# Patient Record
Sex: Female | Born: 1965 | Race: Black or African American | Hispanic: No | Smoking: Never smoker
Health system: Southern US, Community
[De-identification: ages and names within clinical notes are randomized; demographics above are authoritative.]

## PROBLEM LIST (undated history)

## (undated) DIAGNOSIS — I10 Essential (primary) hypertension: Secondary | ICD-10-CM

## (undated) DIAGNOSIS — E119 Type 2 diabetes mellitus without complications: Secondary | ICD-10-CM

## (undated) DIAGNOSIS — T4145XA Adverse effect of unspecified anesthetic, initial encounter: Secondary | ICD-10-CM

## (undated) DIAGNOSIS — M519 Unspecified thoracic, thoracolumbar and lumbosacral intervertebral disc disorder: Secondary | ICD-10-CM

## (undated) DIAGNOSIS — E7800 Pure hypercholesterolemia, unspecified: Secondary | ICD-10-CM

## (undated) HISTORY — PX: DILATION AND CURETTAGE OF UTERUS: SHX78

## (undated) HISTORY — PX: CYST EXCISION: SHX5701

## (undated) HISTORY — PX: CHOLECYSTECTOMY: SHX55

## (undated) HISTORY — PX: BREAST SURGERY: SHX581

---

## 1995-01-24 DIAGNOSIS — T8859XA Other complications of anesthesia, initial encounter: Secondary | ICD-10-CM

## 1995-01-24 HISTORY — PX: CYST EXCISION: SHX5701

## 1995-01-24 HISTORY — DX: Other complications of anesthesia, initial encounter: T88.59XA

## 1998-08-31 ENCOUNTER — Encounter: Payer: Self-pay | Admitting: *Deleted

## 1998-08-31 ENCOUNTER — Inpatient Hospital Stay (HOSPITAL_COMMUNITY): Admission: AD | Admit: 1998-08-31 | Discharge: 1998-08-31 | Payer: Self-pay | Admitting: Family Medicine

## 1998-09-06 ENCOUNTER — Inpatient Hospital Stay (HOSPITAL_COMMUNITY): Admission: AD | Admit: 1998-09-06 | Discharge: 1998-09-06 | Payer: Self-pay | Admitting: Family Medicine

## 1998-09-07 ENCOUNTER — Inpatient Hospital Stay (HOSPITAL_COMMUNITY): Admission: RE | Admit: 1998-09-07 | Discharge: 1998-09-07 | Payer: Self-pay | Admitting: *Deleted

## 1998-09-07 ENCOUNTER — Encounter: Payer: Self-pay | Admitting: *Deleted

## 1998-09-10 ENCOUNTER — Ambulatory Visit (HOSPITAL_COMMUNITY): Admission: RE | Admit: 1998-09-10 | Discharge: 1998-09-10 | Payer: Self-pay | Admitting: *Deleted

## 1998-09-10 ENCOUNTER — Encounter (INDEPENDENT_AMBULATORY_CARE_PROVIDER_SITE_OTHER): Payer: Self-pay | Admitting: Specialist

## 2000-11-29 ENCOUNTER — Other Ambulatory Visit: Admission: RE | Admit: 2000-11-29 | Discharge: 2000-11-29 | Payer: Self-pay | Admitting: *Deleted

## 2001-06-11 ENCOUNTER — Other Ambulatory Visit: Admission: RE | Admit: 2001-06-11 | Discharge: 2001-06-11 | Payer: Self-pay | Admitting: Obstetrics and Gynecology

## 2001-07-03 ENCOUNTER — Other Ambulatory Visit: Admission: RE | Admit: 2001-07-03 | Discharge: 2001-07-03 | Payer: Self-pay | Admitting: Family Medicine

## 2001-07-12 ENCOUNTER — Other Ambulatory Visit: Admission: RE | Admit: 2001-07-12 | Discharge: 2001-07-12 | Payer: Self-pay | Admitting: *Deleted

## 2001-12-16 ENCOUNTER — Other Ambulatory Visit: Admission: RE | Admit: 2001-12-16 | Discharge: 2001-12-16 | Payer: Self-pay | Admitting: *Deleted

## 2003-02-04 ENCOUNTER — Other Ambulatory Visit: Admission: RE | Admit: 2003-02-04 | Discharge: 2003-02-04 | Payer: Self-pay | Admitting: Obstetrics and Gynecology

## 2003-08-23 ENCOUNTER — Inpatient Hospital Stay (HOSPITAL_COMMUNITY): Admission: AD | Admit: 2003-08-23 | Discharge: 2003-08-23 | Payer: Self-pay | Admitting: Obstetrics and Gynecology

## 2003-10-17 ENCOUNTER — Inpatient Hospital Stay (HOSPITAL_COMMUNITY): Admission: AD | Admit: 2003-10-17 | Discharge: 2003-10-17 | Payer: Self-pay | Admitting: Obstetrics and Gynecology

## 2003-11-17 ENCOUNTER — Ambulatory Visit (HOSPITAL_COMMUNITY): Admission: RE | Admit: 2003-11-17 | Discharge: 2003-11-17 | Payer: Self-pay | Admitting: Obstetrics and Gynecology

## 2004-01-11 ENCOUNTER — Inpatient Hospital Stay (HOSPITAL_COMMUNITY): Admission: AD | Admit: 2004-01-11 | Discharge: 2004-01-11 | Payer: Self-pay | Admitting: Obstetrics and Gynecology

## 2004-01-11 ENCOUNTER — Inpatient Hospital Stay (HOSPITAL_COMMUNITY): Admission: AD | Admit: 2004-01-11 | Discharge: 2004-01-13 | Payer: Self-pay | Admitting: Obstetrics and Gynecology

## 2004-01-26 ENCOUNTER — Inpatient Hospital Stay (HOSPITAL_COMMUNITY): Admission: RE | Admit: 2004-01-26 | Discharge: 2004-01-26 | Payer: Self-pay | Admitting: Obstetrics and Gynecology

## 2004-02-12 ENCOUNTER — Inpatient Hospital Stay (HOSPITAL_COMMUNITY): Admission: AD | Admit: 2004-02-12 | Discharge: 2004-02-14 | Payer: Self-pay | Admitting: Obstetrics and Gynecology

## 2004-02-16 ENCOUNTER — Ambulatory Visit (HOSPITAL_COMMUNITY): Admission: RE | Admit: 2004-02-16 | Discharge: 2004-02-16 | Payer: Self-pay | Admitting: Obstetrics and Gynecology

## 2004-02-19 ENCOUNTER — Inpatient Hospital Stay (HOSPITAL_COMMUNITY): Admission: AD | Admit: 2004-02-19 | Discharge: 2004-02-24 | Payer: Self-pay | Admitting: Obstetrics and Gynecology

## 2004-02-22 ENCOUNTER — Encounter (INDEPENDENT_AMBULATORY_CARE_PROVIDER_SITE_OTHER): Payer: Self-pay | Admitting: Specialist

## 2004-02-25 ENCOUNTER — Encounter: Admission: RE | Admit: 2004-02-25 | Discharge: 2004-03-26 | Payer: Self-pay | Admitting: Obstetrics and Gynecology

## 2004-03-24 ENCOUNTER — Inpatient Hospital Stay (HOSPITAL_COMMUNITY): Admission: AD | Admit: 2004-03-24 | Discharge: 2004-03-24 | Payer: Self-pay | Admitting: Obstetrics and Gynecology

## 2004-03-28 ENCOUNTER — Inpatient Hospital Stay (HOSPITAL_COMMUNITY): Admission: AD | Admit: 2004-03-28 | Discharge: 2004-03-28 | Payer: Self-pay | Admitting: Obstetrics and Gynecology

## 2004-04-02 ENCOUNTER — Inpatient Hospital Stay (HOSPITAL_COMMUNITY): Admission: AD | Admit: 2004-04-02 | Discharge: 2004-04-02 | Payer: Self-pay | Admitting: Obstetrics and Gynecology

## 2004-04-05 ENCOUNTER — Other Ambulatory Visit: Admission: RE | Admit: 2004-04-05 | Discharge: 2004-04-05 | Payer: Self-pay | Admitting: Obstetrics and Gynecology

## 2004-04-24 ENCOUNTER — Encounter: Admission: RE | Admit: 2004-04-24 | Discharge: 2004-05-24 | Payer: Self-pay | Admitting: Obstetrics and Gynecology

## 2004-06-24 ENCOUNTER — Encounter: Admission: RE | Admit: 2004-06-24 | Discharge: 2004-07-24 | Payer: Self-pay | Admitting: Obstetrics and Gynecology

## 2004-08-24 ENCOUNTER — Encounter: Admission: RE | Admit: 2004-08-24 | Discharge: 2004-09-23 | Payer: Self-pay | Admitting: Obstetrics and Gynecology

## 2004-09-24 ENCOUNTER — Encounter: Admission: RE | Admit: 2004-09-24 | Discharge: 2004-10-23 | Payer: Self-pay | Admitting: Obstetrics and Gynecology

## 2004-10-24 ENCOUNTER — Encounter: Admission: RE | Admit: 2004-10-24 | Discharge: 2004-11-23 | Payer: Self-pay | Admitting: Obstetrics and Gynecology

## 2004-11-24 ENCOUNTER — Encounter: Admission: RE | Admit: 2004-11-24 | Discharge: 2004-12-24 | Payer: Self-pay | Admitting: Obstetrics and Gynecology

## 2006-12-28 ENCOUNTER — Encounter: Admission: RE | Admit: 2006-12-28 | Discharge: 2006-12-28 | Payer: Self-pay | Admitting: Family Medicine

## 2007-01-01 ENCOUNTER — Encounter: Admission: RE | Admit: 2007-01-01 | Discharge: 2007-01-01 | Payer: Self-pay | Admitting: Family Medicine

## 2007-01-15 ENCOUNTER — Encounter: Admission: RE | Admit: 2007-01-15 | Discharge: 2007-01-15 | Payer: Self-pay | Admitting: Family Medicine

## 2007-10-15 ENCOUNTER — Emergency Department (HOSPITAL_COMMUNITY): Admission: EM | Admit: 2007-10-15 | Discharge: 2007-10-15 | Payer: Self-pay | Admitting: Family Medicine

## 2007-11-10 ENCOUNTER — Emergency Department (HOSPITAL_COMMUNITY): Admission: EM | Admit: 2007-11-10 | Discharge: 2007-11-10 | Payer: Self-pay | Admitting: Emergency Medicine

## 2007-11-12 ENCOUNTER — Encounter: Admission: RE | Admit: 2007-11-12 | Discharge: 2007-11-12 | Payer: Self-pay | Admitting: Surgery

## 2007-12-25 ENCOUNTER — Encounter: Admission: RE | Admit: 2007-12-25 | Discharge: 2007-12-25 | Payer: Self-pay | Admitting: Surgery

## 2007-12-26 ENCOUNTER — Encounter (INDEPENDENT_AMBULATORY_CARE_PROVIDER_SITE_OTHER): Payer: Self-pay | Admitting: Surgery

## 2007-12-26 ENCOUNTER — Ambulatory Visit (HOSPITAL_COMMUNITY): Admission: RE | Admit: 2007-12-26 | Discharge: 2007-12-28 | Payer: Self-pay | Admitting: Surgery

## 2008-03-30 ENCOUNTER — Encounter: Admission: RE | Admit: 2008-03-30 | Discharge: 2008-03-30 | Payer: Self-pay | Admitting: Surgery

## 2010-02-13 ENCOUNTER — Encounter: Payer: Self-pay | Admitting: Obstetrics and Gynecology

## 2010-03-10 IMAGING — MG MM DIAGNOSTIC UNILATERAL R
2 series · 2 of 2 positions shown · non-contrast
Comparison: Previous examinations, the most recent dated
01/15/2007, 01/01/2007 and 12/28/2006.

CLINICAL DATA: Right breast swelling, redness and increased
warmth.  The patient recently started on antibiotics.  Clinical
concern for the possibility of an abscess.

DIGITAL DIAGNOSTIC  RIGHT  MAMMOGRAM  WITH CAD AND RIGHT BREAST
ULTRASOUND:

[R CC]
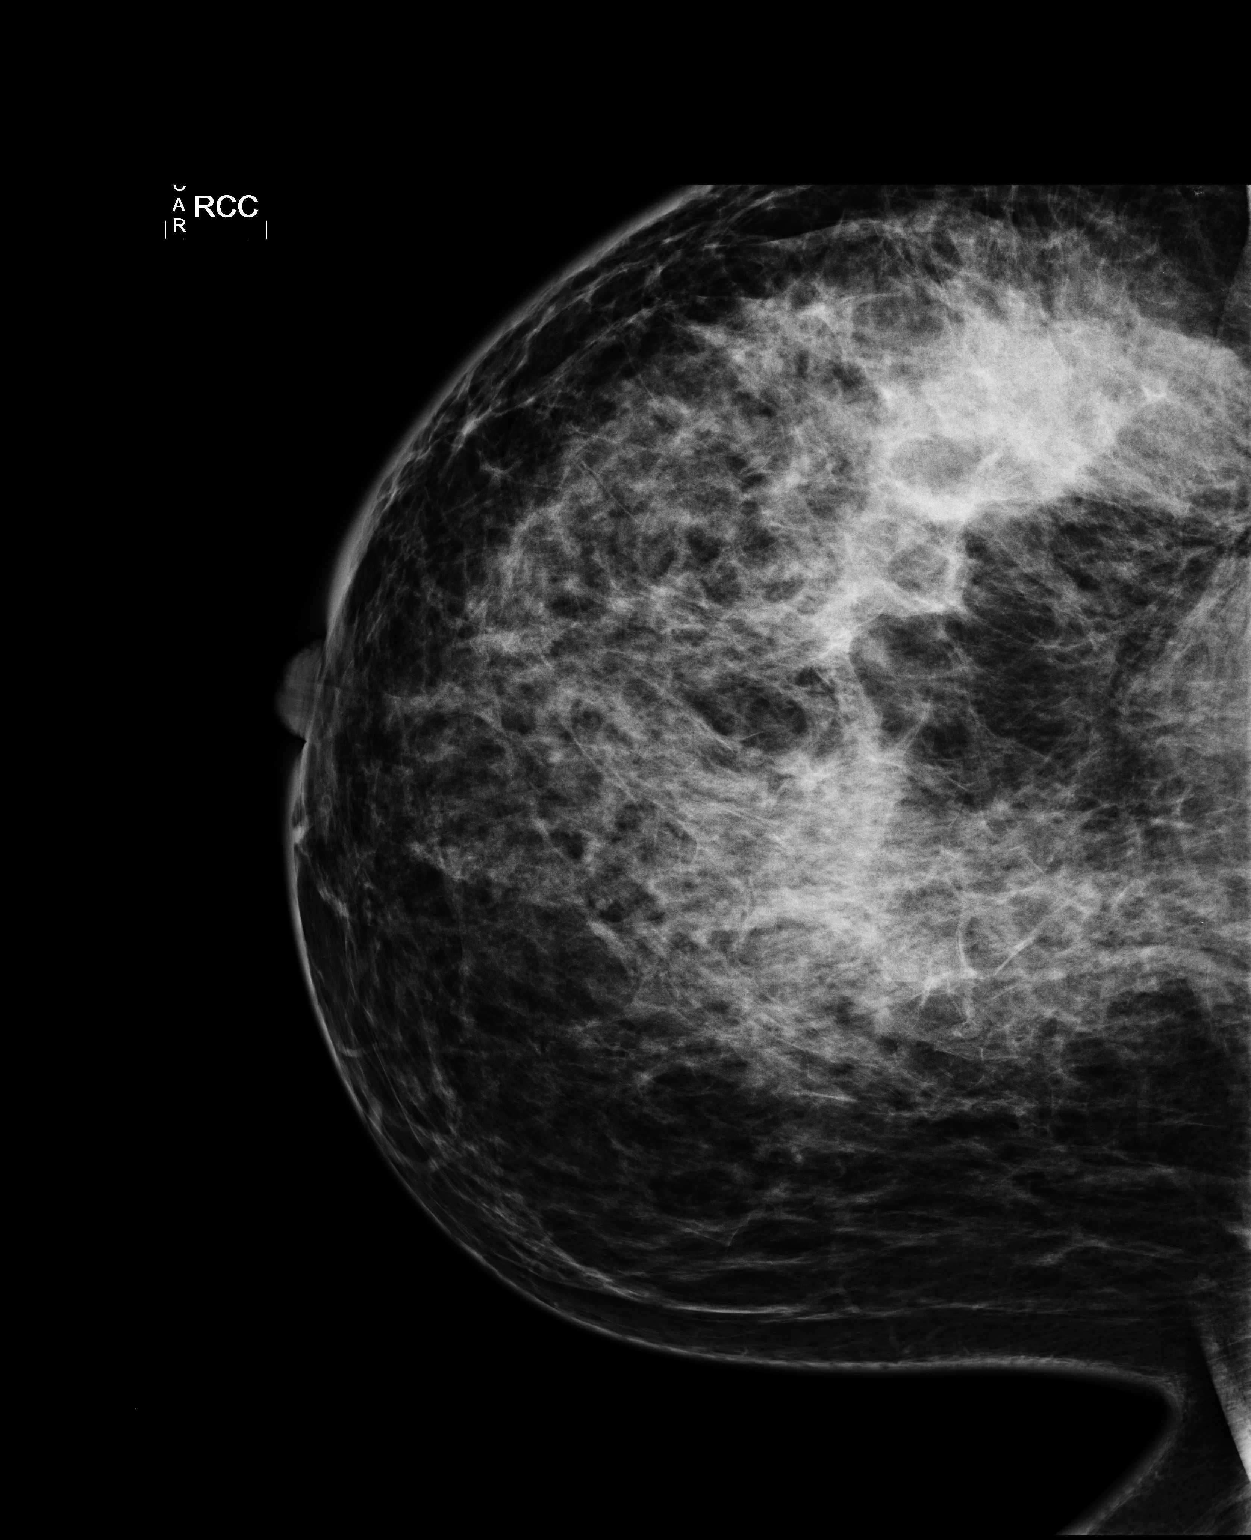

[R MLO]
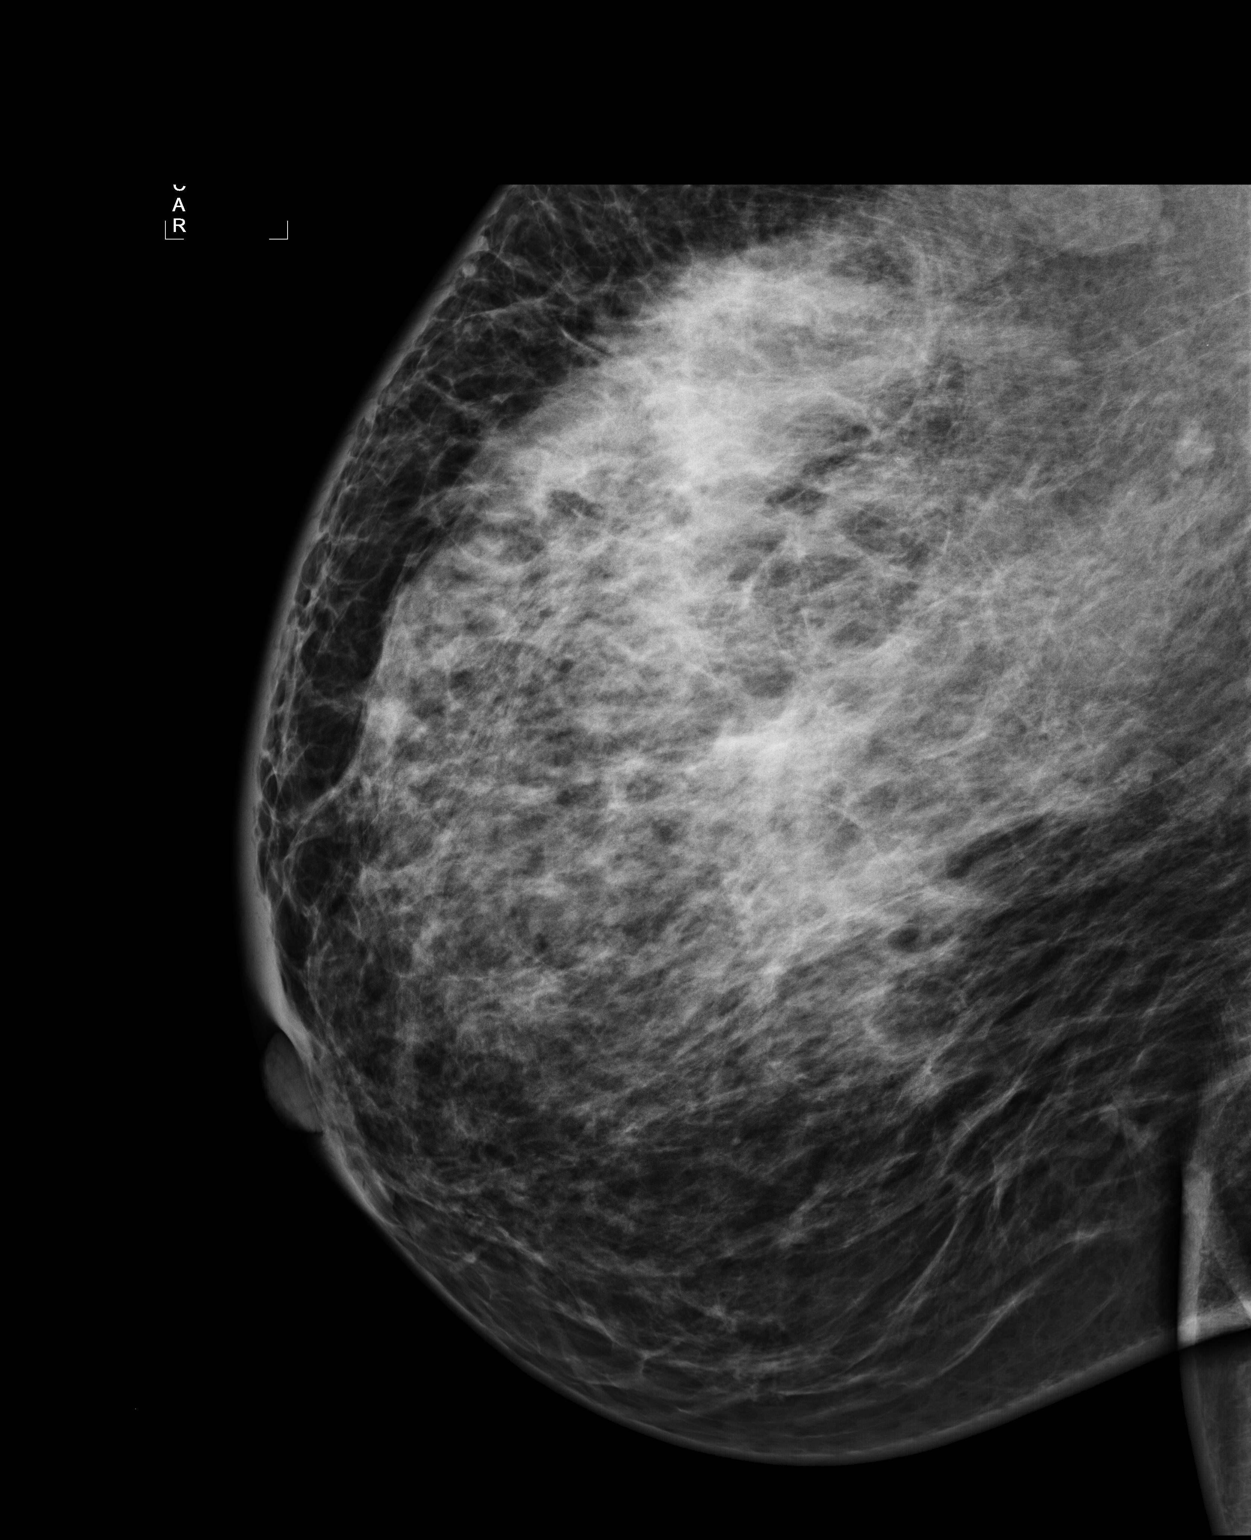

[2 of 2 positions shown; findings below may reference images not displayed]

FINDINGS: Mammographic views of the right breast demonstrate an
overall increase in density of heterogeneously dense parenchyma.
There is also interval skin thickening, primarily in the upper
outer portion of the breast.  There has been an interval increase
in the size of multiple right axillary lymph nodes.  No discrete
mass or other findings suspicious for malignancy or abscess
formation are demonstrated.  The previously evaluated probably
benign microcalcifications deep in the outer right breast are
unchanged.  Spot magnification views of the calcifications were not
obtained today due to the tenderness of the breast.

On physical exam, a large area of firm, palpable soft tissue
thickening is demonstrated in the upper outer right breast.

Ultrasound is performed, showing extensive edematous changes
throughout the region of palpable soft tissue thickening in the
upper outer right breast.  No discrete masses or fluid collections
are seen.  A small, normal sized intramammary lymph node is
demonstrated adjacent to the the area of edematous changes.  There
is also an enlarged right axillary lymph node with a preserved
fatty hilum, measuring 3.1 cm in maximum diameter.
IMPRESSION: 1.  Extensive cellulitis in the upper outer quadrant of the right
breast with associated reactive lymph nodes.  Inflammatory breast
cancer with metastatic lymph nodes cannot be excluded.  Therefore,
further evaluation will be necessary if the patient's symptoms do
not resolve with antibiotics.  No abscess is seen.
2.  Stable probably benign right breast calcifications.  However,
these could not be evaluated with magnification views today, as
described above.  When the patient is due for her next mammogram,
in two months, it is recommended that this be performed as a
diagnostic mammogram, with spot magnification views on the right.

BI-RADS CATEGORY 3:  Probably benign finding(s) - short interval
follow-up suggested.

## 2010-06-07 NOTE — Discharge Summary (Signed)
[name], [name]              ACCOUNT NO.:  [id number]   MEDICAL RECORD NO.:  [id number]          PATIENT TYPE:  OIB   LOCATION:  5127                         FACILITY:  [facility]   PHYSICIAN:  Currie Paris, M.D.DATE OF BIRTH:  Nov 04, 1965   DATE OF ADMISSION:  12/26/2007  DATE OF DISCHARGE:  12/28/2007                               DISCHARGE SUMMARY   FINAL DIAGNOSES:  1. Complex right breast abscess.  2. Hypertension.   CLINICAL HISTORY:  [name] is a 45 year old lady who has been  followed in our office, often on antibiotics for what looked like a  breast mastitis.  The day prior to admission, she had been reevaluated  in the office and found to have a developed complex abscess with  multiple loculations.  She was admitted on December 26, 2007, for  intraoperative drainage of her complex breast abscess.   HOSPITAL COURSE:  The patient was admitted and taken to the operating  room.  Under general anesthesia, the complex breast abscess was opened.  There are multiple loculations all of which were broken up.  Cultures  were taken.  Biopsies were taken.  The wound was packed with Kerlix.   The next day, the patient felt much better.  We had planned at the time  of surgery to bring her back to the operating room for dressing change  and this was accomplished on December 27, 2007.  This was done again  under anesthesia and she tolerated that well.  On December 28, 2007, she  was continued to feel well.  She was afebrile.  Her pain was  considerably improved from preoperatively.  I was able to do her  dressing repacking in the room on December 28, 2007, with some morphine  sedation and she tolerated that well.  I thought this could be managed  as an outpatient.  Culture report was still pending, but there was no  growth on the cultures at the time of discharge.  Pathology report  showed inflammatory changes.  There was no evidence of tumor.   The patient will go home on  Vicodin for pain, Keflex for the infection,  and be followed in my office in approximately 48 hours for a wound  change.      Currie Paris, M.D.  Electronically Signed     CJS/MEDQ  D:  12/28/2007  T:  12/28/2007  Job:  [id number]

## 2010-06-07 NOTE — Op Note (Signed)
NAMEIONE, SANDUSKY              ACCOUNT NO.:  [id number]   MEDICAL RECORD NO.:  [id number]          PATIENT TYPE:  AMB   LOCATION:  SDS                          FACILITY:  [facility]   PHYSICIAN:  Currie Paris, M.D.DATE OF BIRTH:  [birth year]   DATE OF PROCEDURE:  12/27/2007  DATE OF DISCHARGE:                               OPERATIVE REPORT   PREOPERATIVE DIAGNOSIS:  Complex breast abscess, status post drainage  and debridement.   POSTOPERATIVE DIAGNOSIS:  Complex breast abscess, status post drainage  and debridement.   OPERATION:  Dressing change under anesthesia.   SURGEON:  Currie Paris, MD   ASSISTANT:  Letha Cape, PA   ANESTHESIA:  General.   CLINICAL HISTORY:  This is a 45 year old lady who had a complex breast  abscess, which was drained intraoperatively yesterday.  There were  multiple small cavities and tracts that were opened widely yesterday.  A  large Kerlix had been placed for packing.   DESCRIPTION OF PROCEDURE:  The patient was seen in the holding area and  had no further questions.  We confirmed dressing change under anesthesia  as the planned procedure, and the right breast was marked as the  operative site.   The patient was taken to the operating room and after satisfactory  general anesthesia had been obtained, the breast was prepped and draped  and the time-out done.  The Kerlix was removed after she was asleep.  I  carefully inspected the wound and the wound appeared very clean.  There  appeared to be no other pockets of frank pus.  There were multiple side  pockets had been noted yesterday, but they all appeared to be clean.  I  irrigated with about 1 L of saline.  I made sure everything was dry, but  there had been no bleeding during this portion of the procedure.  I then  packed this again with some Kerlix using several pieces, so I could get  each of the small subcavities packed.  Sterile dressings were applied.  The patient  tolerated the procedure well.  There were no complications.  All counts were correct.      Currie Paris, M.D.  Electronically Signed     CJS/MEDQ  D:  12/27/2007  T:  12/27/2007  Job:  [id number]

## 2010-06-07 NOTE — Op Note (Signed)
[name], [name]              ACCOUNT NO.:  [id number]   MEDICAL RECORD NO.:  [id number]          PATIENT TYPE:  OIB   LOCATION:  5127                         FACILITY:  [facility]   PHYSICIAN:  Currie Paris, M.D.DATE OF BIRTH:  December 11, 1965   DATE OF PROCEDURE:  12/26/2007  DATE OF DISCHARGE:                               OPERATIVE REPORT   PREOPERATIVE DIAGNOSIS:  Complex multiloculated abscess, right breast.   POSTOPERATIVE DIAGNOSIS:  Complex multiloculated abscess, right breast.   OPERATION:  Wide incision and drainage of complex breast abscess with  breast biopsy.   SURGEON:  Currie Paris, MD   ANESTHESIA:  General.   CLINICAL HISTORY:  This is a 45 year old lady who has developed a  mastitis, was initially treated with some antibiotics and it did not  look like she had any drainable areas, but on presentation to our office  yesterday appeared to have an abscess developed and it would be amenable  to drains.  Ultrasound showed a multiloculated abscess.   DESCRIPTION OF PROCEDURE:  The patient was seen in the holding area and  I reviewed her situation with her and examined her.  She had a fluctuant  area that was bulging in the upper outer quadrant of the right breast, a  couple centimeters in the areolar margin with a large area of  inflammatory changes involving basically entire upper half of her  breast.  She understood the plans for the procedure and had no further  questions.   The patient was taken to the operating room and after satisfactory  general anesthesia had been obtained, the right breast prepped and  draped and the time-out was done.  I made a curvilinear incision  directly over the fluctuant area and entered the cavity with purulent  material draining which was cultured.  I began diligently to break up  some loculations and found that I needed to open the incision more  laterally and more medially to get good wide drainage.  Initially, I  thought I had everything opened up, but then would intermittently see  areas of purulent drainage at the edges where I had broken up and  looking at those areas and probing, I found additional pockets.  I spent  a long time trying to irrigate all of this out and break up all of these  loculations and finally thought that I had everything completely opened  and widely drained.  I irrigated it copiously.  I took several biopsies  of portions of the wall to be sure there was not carcinoma here, but  this all looked like inflammatory changes consistent with a chronic  longstanding abscess with multiple loculations.   Once I thought everything was dry, I went ahead and put a moist gauze  packing in.  I believe she will need to come back to the operating room  for dressing change under anesthesia in 24 hours.   The patient tolerated the procedure well.  There were no operative  complications.  All counts were correct.      Currie Paris, M.D.  Electronically Signed  CJS/MEDQ  D:  12/26/2007  T:  12/26/2007  Job:  [id number]   cc:   Renaye Rakers, M.D.  [facility]

## 2010-06-10 NOTE — Discharge Summary (Signed)
[name], [name]              ACCOUNT NO.:  [id number]   MEDICAL RECORD NO.:  [id number]          PATIENT TYPE:  INP   LOCATION:  9314                          [facility]   PHYSICIAN:  Janine Limbo, M.D.DATE OF BIRTH:  [birth year]   DATE OF ADMISSION:  02/19/2004  DATE OF DISCHARGE:                                 DISCHARGE SUMMARY   ADMITTING DIAGNOSES:  1.  Intrauterine pregnancy at 32 and three-sevenths weeks.  2.  Preterm labor with positive fetal fibronectin.  3.  Positive group B streptococcus.  4.  Chronic hypertension with superimposed preeclampsia.  5.  Status post betamethasone preparation.  6.  Estimated fetal weight 90-95th percentile.   PROCEDURE:  Normal spontaneous vaginal delivery.   DISCHARGE DIAGNOSES:  1.  Normal spontaneous vaginal delivery at 32 and six-sevenths weeks.  2.  Improved preeclampsia.   [name] is a 45 year old gravida 3 para 1-0-1-1 who presents for  evaluation of preterm labor, chronic hypertension, and was found to have  superimposed preeclampsia with a total protein for 24-hour urine of 547 mg.  Her blood work has otherwise remained within normal limits with regard to  [facility] lab work.  On February 22, 2004 discussion was entered into with the  patient regarding her status and it was recommended per discussion dated  February 22, 2004 at 1100 by Dr. Dierdre Forth that induction of labor was  recommended.  The patient discussed prognosis with neonatologist and then  agreed to proceed with induction of labor for preeclampsia.  Artificial  rupture of membranes was accomplished on February 22, 2004 at 2110 by Dr.  Dierdre Forth with return of clear fluid.  At 2228, Dr. Pennie Rushing was  notified of variable decelerations which started immediately after epidural.  In the 2 minutes following that, Dr. Pennie Rushing was called immediately for  delivery.  Delivery occurred at 2234 and was attended by Nigel Bridgeman,  C.N.M. for a normal  spontaneous vaginal delivery over intact perineum.  Placenta was spontaneous and intact.  The patient gave birth to a 5-pound 15-  ounce female infant named Jalen with Apgar scores of 8 at one minute and 8 at  five minutes.  The baby is in NICU and stable.  The patient's blood  pressures remained stable in the postpartum period - 130s to 150s over 60s  to 80s.  Her PIH labs remained in normal limits and she continued on  magnesium sulfate x24 hours, at which time magnesium sulfate was  discontinued.  The patient's blood pressures remained stable in the 140s  over 80s range.  Her weight on her postpartum day #1 was 221; today her  weight is 220.  Her hemoglobin on postpartum day #1 was 12.5 and her vital  signs remained stable on Aldomet 500 mg p.o. b.i.d.  In light of her stable  condition, she is satisfactory for discharge per Dr. Marline Backbone.   DISCHARGE INSTRUCTIONS:  Per [facility] handout.   DISCHARGE MEDICATIONS:  1.  Aldomet 500 mg p.o. b.i.d.  2.  Motrin 600 mg p.o. q.6h. p.r.n. pain.  3.  Prenatal  vitamins.   The patient will return in 1 week for evaluation at the office of [facility].  She  is to bring measurement of daily weight with her.  She is to call for any  signs or symptoms of PIH:  Headache, visual changes, or epigastric pain, or  any problems or concerns.      SDM/MEDQ  D:  02/24/2004  T:  02/24/2004  Job:  [id number]

## 2010-06-10 NOTE — Discharge Summary (Signed)
[name], [name]              ACCOUNT NO.:  [id number]   MEDICAL RECORD NO.:  [id number]          PATIENT TYPE:  INP   LOCATION:  9168                          [facility]   PHYSICIAN:  Osborn Coho, M.D.   DATE OF BIRTH:  Aug 23, 1965   DATE OF ADMISSION:  02/12/2004  DATE OF DISCHARGE:                                 DISCHARGE SUMMARY   ADMITTING DIAGNOSES:  1.  Intrauterine pregnancy at 22 and two-sevenths weeks.  2.  Chronic hypertension, on Aldomet.  3.  Preterm labor.  4.  Positive fetal fibronectin.   DISCHARGE DIAGNOSES:  1.  Intrauterine pregnancy at 92 and two-sevenths weeks.  2.  Chronic hypertension, on Aldomet.  3.  Preterm labor stable with no cervical change.  4.  Positive fetal fibronectin.   [name] is a 45 year old gravida 3 para 1-0-1-1 who presented at 41 and  two-sevenths weeks with contractions two to four per hour.  Her cervix was 1  cm, 50-60%, with the vertex at a -2 station and ballotable.  She had a  positive fetal fibronectin on February 11, 2004 and received betamethasone  January 11, 2004.  Terbutaline subcu was unsuccessful in stopping  contractions and the patient was admitted for magnesium sulfate.  On  magnesium sulfate IV at 2 g/hour, the patient's contractions significantly  decreased.  Her baby remained reactive and reassuring throughout her  admission.  She continued to contract irregularly but was weaned off the  magnesium on February 14, 2004 with no increase in level of the contractions.  Her cervix remained 1, 75%, -2, and ballotable which was unchanged from  previous exam.  She was therefore deemed stable and decision was made to be  discharged home on level 3 bedrest.  The patient is to take terbutaline 2.5  mg p.o. q.4h. at home and Aldomet as directed.  She will follow up in the  office of [facility] as scheduled on Tuesday and Friday this week for NST.  She  will call for any signs or symptoms of increased preterm labor, any  decreased fetal movement, vaginal bleeding or rupture of membranes, or any  problems or concerns.      SDM/MEDQ  D:  02/14/2004  T:  02/14/2004  Job:  [id number]

## 2010-06-10 NOTE — H&P (Signed)
[name], [name]              ACCOUNT NO.:  [id number]   MEDICAL RECORD NO.:  [id number]          PATIENT TYPE:  INP   LOCATION:  9168                          [facility]   PHYSICIAN:  Osborn Coho, M.D.   DATE OF BIRTH:  05-Apr-1965   DATE OF ADMISSION:  02/12/2004  DATE OF DISCHARGE:                                HISTORY & PHYSICAL   [name] is a 44 year old gravida 3 para 1-0-1-1 at 31-2/7ths weeks who  presents with 2 to 4 uterine contractions per hour in the last 24 hours.  She was seen, on February 11, 2004, at [facility].  Her cervix  at that time was 1-cm, 50 to 60%, vertex, to the -2 station with ballotable  nature.  Positive fetal fibronectin was noted on that day.  Yesterday she  was instructed to call back if she should have more contractions, despite  being on terbutaline on home.  She has had betamethasone on January 11, 2004.  She has been on terbutaline 2.5 mg p.o. q.4h. with her last dose at  10:30 a.m. this morning.  She denies any leaking or bleeding and reports  positive fetal movement.  She denies dysuria, nausea, vomiting.   Her pregnancy has been remarkable for:  1.  Chronic hypertension with the patient on Aldomet 250 mg b.i.d.  2.  Positive for Group B strep.  3.  Positive fetal fibronectin with previous betamethasone.  4.  Advanced maternal age with normal amnio.  5.  First trimester bleeding.   PRENATAL LABS:  Blood type is O positive.  Rh antibody negative.  VDRL  nonreactive.  __________  titer positive.  Hepatitis B surface antigen  negative.  HIV nonreactive.  Sickle cell test negative.  GC and Chlamydia  cultures were negative in the first trimester.  They were negative again on  January 12, 2004.  Hemoglobin, upon entering the practice, was 12.1.  It  was 11 at 27 weeks.  Group B strep culture was positive in December.  EDC of  April 12, 2004 was established by last menstrual period and was in agreement  with  ultrasound at 6 and 18 weeks.   HISTORY OF PRESENT PREGNANCY:  The patient entered care at approximately ten  weeks.  A Pap was done at her first visit and was normal.  She declined  amniocentesis and decided quadruple screen.  Her pressure at her OB visit  was 140/88.  She was placed on Aldomet at the beginning of her pregnancy 250  mg b.i.d.  She had an ultrasound at 26 weeks showing growth at the 75th to  90th percentile.  Cervix was 2.2-cm long, 1-2 by exam long, -3.  She was  sent to maternity admissions unit to check for contractions.  She received  betamethasone at that time.  Fetal fibronectin was negative at that point.  One hour Glucola was 137, three hour GTT had one abnormal value on the three  hour value at 159.  She was seen here at maternity admissions, on January 26, 2004, had a negative fetal fibronectin, then had  a followup yesterday that  was positive.   OBSTETRICAL HISTORY:  1.  In 1995, she had a vaginal birth of a female infant, weight 7 pounds 13      ounces at 41 weeks.  She was in labor eight and a half hours.  This was      a vacuum-assisted vaginal birth by Dr. Stefano Gaul.  She had epidural      anesthesia.  2.  In 2000, she had a first trimester miscarriage with a D&E.   MEDICAL HISTORY:  1.  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  2.  She reports usual childhood illnesses.  3.  She was diagnosed with hypertension, in 1998, and has been on Aldomet      250 p.o. b.i.d.   SURGICAL HISTORY:  1.  Lap cholecystectomy in 1999.  2.  She also had a cyst removed from her head in the past.  3.  D&C in 2000.   FAMILY HISTORY:  Her mother, father, sister, and brother all have  hypertension.  Her maternal grandmother had a seizure.  Her mother and  maternal grandmother have diabetes.   GENETIC HISTORY:  Remarkable for the patient being 38 at the time of  delivery.   SOCIAL HISTORY:  The patient is married to the father of the baby.  He is  involved  and supportive.  His name is Veretta Sabourin.  The patient is African  American of the [employer].  She is employed as a Merchandiser, retail in  [employer], arts.  Her husband is employed at [employer].  She has been  followed by the physician service at [facility] .  She denies  any alcohol, drug, or tobacco use during this pregnancy.   PHYSICAL EXAMINATION:  VITAL SIGNS:  Blood pressures are 149/81, 157/98,  other vital signs are stable.  HEENT:  Within normal limits.  LUNGS:  Breath sounds are clear.  HEART:  Regular rate and rhythm without murmur.  BREASTS:  Soft and nontender.  ABDOMEN:  Fundal height is approximately 32-cm.  Uterine contractions are 4-  6 per hour, mild.  Clean catch urine showed a specific gravity of 1.004, all  else negative.  Cervix is a loose, 1-cm, 80% vertex, at a -1 station with a  vertex well applied.  Membranes tight against the vertex.  EXTREMITIES:  Deep tendon reflexes are 2+ without clonus.  There is a trace  edema noted.   IMPRESSION:  1.  Intrauterine pregnancy, 31-2/7ths weeks.  2.  Preterm labor.  3.  Positive fetal fibronectin.  4.  Positive Group B strep.  5.  Chronic hypertension.   PLAN:  1.  Admit for magnesium sulfate per Dr. Su Hilt as attending physician.  2.  Aldomet 250 mg one p.o. b.i.d.  3.  M.D. will follow.     Vick   VLL/MEDQ  D:  02/12/2004  T:  02/12/2004  Job:  [id number]

## 2010-06-10 NOTE — H&P (Signed)
[name], [name]              ACCOUNT NO.:  [id number]   MEDICAL RECORD NO.:  [id number]          PATIENT TYPE:  INP   LOCATION:  9157                          [facility]ACILITY[facility]  [facility]   PHYSICIAN:  Janine Limbo, M.D.DATE OF BIRTH:  [birth year]   DATE OF ADMISSION:  01/11/2004  DATE OF DISCHARGE:                                HISTORY & PHYSICAL   HISTORY OF PRESENT ILLNESS:  This is a 45 year old gravida 3, para 1-0-1-1-  at 26-6/7 weeks who presents with complaints of uterine cramping.  She was  seen earlier today with no uterine contractions observed at that time, but  she did have premature dilation at 2 cm.  Fetal fibronectin was done today  and returned as positive.  She received her first dose of betamethasone  earlier today.  Her pregnancy has been followed by Dr. Janine Limbo  and remarkable for:  #1 - AMA (declined amniocentesis), #2 - first trimester  bleeding, #3 - chronic hypertension.   OBSTETRICAL HISTORY:  OB history is remarkable for a vacuum-assisted vaginal  delivery in 1995 of a female infant at 73 weeks' gestation weighing 7 pounds  13 ounces with no complications.  She had a spontaneous abortion in 2000 in  the first trimester with a D&E.   MEDICAL HISTORY:  Medical history is remarkable for:  1.  HPV.  2.  Childhood varicella.  3.  Chronic hypertension.   FAMILY HISTORY:  Family history is remarkable for mother, father, sister and  brother with hypertension; maternal grandmother with seizures; mother and  grandmother with diabetes.   SURGICAL HISTORY:  Surgical history is remarkable for:  1.  Laparoscopic cholecystectomy in 1999.  2.  A cyst removed from her head.  3.  A D&C.   GENETIC HISTORY:  Unremarkable.   SOCIAL HISTORY:  The patient is married to [place], who is involved  and supportive.  She is of the [place] faith.  She denies any alcohol,  tobacco or drug use.   PRENATAL LABORATORIES:  Hemoglobin 12.1, platelets 340,000.  Blood  type O-  positive, antibody screen negative.  Sickle cell negative.  RPR nonreactive.  Rubella immune.  Hepatitis negative.  HIV negative.  Gonorrhea negative.  Chlamydia negative.  Fetal fibronectin positive.   HISTORY OF CURRENT PREGNANCY:  Patient entered care at 10 weeks' gestation.  She initially wanted an amniocentesis for genetic studies, but later  declined.  She had a normal ultrasound at [facility].   OBJECTIVE DATA:  VITAL SIGNS:  Vital signs stable at blood pressure of  146/89, afebrile.  HEENT:  Within normal limits.  NECK:  Thyroid normal, not enlarged.  CHEST:  Chest clear to auscultation.  HEART:  Heart rate regular rate and rhythm.  ABDOMEN:  Abdomen gravid at 27 cm, vertex to Leopold's.  EFM shows reactive  fetal heart rate tracing with uterine contractions every 2-3 minutes prior  to terbutaline and she stopped having contractions after 1 dose of  terbutaline.  PELVIC:  Cervix is 2 cm and 2.2 cm per earlier ultrasound.   ASSESSMENT:  1.  Intrauterine pregnancy at  26-6/7 weeks.  2.  Preterm labor with positive fetal fibronectin.   PLAN:  1.  Admit for monitoring and tocolysis.  2.  Terbutaline p.r.n.  3.  The patient needs gonorrhea, Chlamydia and group B strep done tomorrow      per Dr. Samule Ohm A. Dillard.  4.  Bedrest and monitoring.  5.  Further orders to follow.     Mari   MLW/MEDQ  D:  01/11/2004  T:  01/12/2004  Job:  [id number]

## 2010-06-10 NOTE — H&P (Signed)
[name], [name]              ACCOUNT NO.:  [id number]   MEDICAL RECORD NO.:  [id number]          PATIENT TYPE:  INP   LOCATION:  9172                          FACILITY:  [facility]   PHYSICIAN:  Naima A. Dillard, M.D. DATE OF BIRTH:  [birth year]   DATE OF ADMISSION:  02/19/2004  DATE OF DISCHARGE:                                HISTORY & PHYSICAL   HISTORY OF PRESENT ILLNESS:  This is a 45 year old gravida 3 para 1-0-1-1 at  9 and three-sevenths weeks who presents for evaluation of preterm labor and  chronic hypertension.  She denies pain with contractions, leaking, or  bleeding, and reports positive fetal movement.  She has had a positive fetal  fibronectin on February 11, 2004 and received betamethasone on that day.  She  was in the hospital for 2 days at that time and was discharged home on  February 14, 2004 with a cervix of 1, 75%, and -2.  Her cervix today is 2,  90%, and -1 with a vertex presentation and she is being admitted for  observation for preterm labor and hypertension.   Her pregnancy has been followed by Dr. Stefano Gaul and remarkable for:  1.  Chronic hypertension.  2.  Group B strep positive.  3.  Positive fetal fibronectin with history of receiving betamethasone.  4.  Advanced maternal age with normal amniocentesis.  5.  First trimester bleeding.   PRENATAL LABORATORY DATA:  Her hemoglobin 12.1.  Blood type O positive,  antibody screen negative.  RPR nonreactive.  Rubella immune.  Hepatitis  negative. HIV negative.  Sickle cell negative.  GC/chlamydia negative in the  first trimester and again in December.  Group B strep positive in December.   HISTORY OF CURRENT PREGNANCY:  The patient entered care at [redacted] weeks  gestation.  Pap was done at her visit and was normal.  She had an initial  blood pressure at her first visit of 140/88.  She was placed on Aldomet 250  mg b.i.d.  She had an ultrasound at 6 and 18 weeks which were normal.  She  had an ultrasound at 26 weeks  showing growth in the 75-90 percentile.  Cervix was 2.2 cm long at that time.  She was sent to maternity admissions  to check for contractions and received betamethasone at that time.  She  initially had a negative fetal fibronectin.  She had a 1-hour Glucola of 137  and then a 3-hour GTT which had one abnormal value.  She had another fetal  fibronectin on January 3 which was negative, followed by a positive one on  January 19.   OBSTETRICAL HISTORY:  Remarkable for vaginal vacuum-assisted birth in 33  of a female infant weighing 7 pounds 13 ounces at [redacted] weeks gestation.  In 2000  she had a first trimester miscarriage with a D&E.   MEDICAL HISTORY:  Remarkable for an abnormal Pap with HPV changes, usual  childhood illnesses.  She was diagnosed with chronic hypertension in 1998  and has been on Aldomet.   SURGICAL HISTORY:  Remarkable for a laparoscopic cholecystectomy in 1999, a  cyst removed from her head, and a D&C in 2000.   FAMILY HISTORY:  Remarkable for her mother, father, sister, and brother  having hypertension; her maternal grandmother with a seizure; and mother and  maternal grandmother with diabetes.   GENETIC HISTORY:  Remarkable for the patient's age of 59.   SOCIAL HISTORY:  The patient is married to [place] who is involved and  supportive.  She is of the [place] faith.  She is employed as a Merchandiser, retail  in [place], [employer].  Her husband is employed at [employer].  She denies  any alcohol, tobacco, or drug use.   CURRENT MEDICATIONS:  1.  Prenatal vitamins one p.o. daily.  2.  Aldomet 250 mg b.i.d. which was later changed to 500 mg b.i.d.  3.  Terbutaline 2.5 mg q.6h.   ALLERGIES:  None.   OBJECTIVE DATA:  VITAL SIGNS:  Stable.  Blood pressure 150/79.  Weight 228.  HEENT:  Within normal limits.  Thyroid normal and not enlarged.  CHEST:  Clear to auscultation.  HEART:  Regular rate and rhythm.  ABDOMEN:  Gravid at 33 cm, vertex to Leopold's.  EFM shows  a reactive fetal  heart rate with uterine contractions every 3 minutes which are mild.  Her  cervix in the office today was 2, 90%, and -1 with a vertex presentation and  intact membranes.  EXTREMITIES:  Within normal limits except for trace edema.   ASSESSMENT:  1.  Intrauterine pregnancy at 32 and three-sevenths weeks.  2.  Preterm labor.  3.  Positive fetal fibronectin in the past.  4.  Group B strep positive.  5.  Chronic hypertension.   PLAN:  1.  Admit to birthing suites per Dr. Normand Sloop.  2.  Routine M.D. orders.  3.  Electronic fetal monitoring.  4.  PIH labs and 24-hour urine.  5.  Aldomet 500 mg p.o. b.i.d.   Further orders to follow.      MLW/MEDQ  D:  02/19/2004  T:  02/19/2004  Job:  [id number]

## 2010-06-10 NOTE — Discharge Summary (Signed)
[name], [name]              ACCOUNT NO.:  [id number]   MEDICAL RECORD NO.:  [id number]          PATIENT TYPE:  INP   LOCATION:  9157                          [facility]ACILITY:  [facility]   PHYSICIAN:  Janine Limbo, M.D.DATE OF BIRTH:  October 22, 1965   DATE OF ADMISSION:  01/11/2004  DATE OF DISCHARGE:                                 DISCHARGE SUMMARY   ADMITTING DIAGNOSIS:  1.  Intrauterine pregnancy at 26 and six-sevenths weeks.  2.  Preterm labor with cervical change.  3.  Positive fetal fibronectin.   DISCHARGE DIAGNOSES:  1.  Intrauterine pregnancy at 26 and six-sevenths weeks.  2.  Preterm labor with cervical change.  3.  Positive fetal fibronectin.  4.  Stable, with no further contractions, no further cervical change.   [name] is a 45 year old gravida 3 para 1-0-1-1 who presents at 56 and  six-sevenths weeks with complaints of uterine contractions and cramping.  She was seen earlier today.  No uterine contractions were observed on the  monitor at that time.  However, she was noted to have premature cervical  dilation to 2 cm.  Her fetal fibronectin was done and returned as positive.  She was therefore admitted to [facility] for  observation, tocolysis if necessary, and to receive betamethasone.  Throughout her admission, her vital signs have remained stable.  She is  afebrile.  Her fetal heart rate has remained reactive with some variable  decelerations.  On admission, she was contracting every 2-3 minutes and did  require terbutaline subcu.  However, no further tocolysis has been needed  and her contractions have become sporadic and now ceased.  She received  betamethasone on December 19 and on January 12, 2004.  Her GC and chlamydia  and group B strep were obtained on January 12, 2004.  The GC and chlamydia  cultures are negative.  Group B strep is pending.  The patient was started  on penicillin G prophylactically and if group B strep is positive,  will  receive p.o. antibiotics on discharge, and if group B strep is negative  penicillin G will be discontinued.  Her abdomen is soft and nontender and  she has not experienced any further cervical change.  Therefore, per Dr.  Marline Backbone, she is discharged home to be on complete bedrest in stable  condition.   ASSESSMENT:  1.  Intrauterine pregnancy at 27 and one-sevenths weeks.  2.  Preterm labor with cervical change and positive fetal fibronectin,      stable.   PLAN:  1.  Discharge home per Dr. Marline Backbone.  2.  Strict bedrest.  3.  The patient is to call for consistent contractions that become painful      and regular, and any contraction pattern that is greater than four per      hour.  The signs and symptoms of preterm labor were again reviewed and      the      patient will call for any symptoms of preterm labor, any problems or      concerns.  Fetal kick counts were reviewed  and the patient will call for      decreased fetal movement.  She is therefore discharged home and will      follow up at the office of [facility] in 1 week.     [name]   SDM/MEDQ  D:  01/13/2004  T:  01/13/2004  Job:  [id number]

## 2010-10-28 LAB — BASIC METABOLIC PANEL
BUN: 10 mg/dL (ref 6–23)
CO2: 28 mEq/L (ref 19–32)
Calcium: 9.1 mg/dL (ref 8.4–10.5)
Chloride: 101 mEq/L (ref 96–112)
Creatinine, Ser: 0.68 mg/dL (ref 0.4–1.2)
GFR calc Af Amer: >60 mL/min (ref >60)
GFR calc non Af Amer: >60 mL/min (ref >60)
Glucose, Bld: 96 mg/dL (ref 70–99)
Potassium: 3.8 mEq/L (ref 3.5–5.1)
Sodium: 137 mEq/L (ref 135–145)

## 2010-10-28 LAB — ANAEROBIC CULTURE

## 2010-10-28 LAB — CBC
HCT: 27 % — ABNORMAL LOW (ref 36.0–46.0)
HCT: 30.3 % — ABNORMAL LOW (ref 36.0–46.0)
Hemoglobin: 10.1 g/dL — ABNORMAL LOW (ref 12.0–15.0)
Hemoglobin: 9.2 g/dL — ABNORMAL LOW (ref 12.0–15.0)
MCHC: 33.5 g/dL (ref 30.0–36.0)
MCHC: 34.1 g/dL (ref 30.0–36.0)
MCV: 80.7 fL (ref 78.0–100.0)
MCV: 81.2 fL (ref 78.0–100.0)
Platelets: 423 10*3/uL — ABNORMAL HIGH (ref 150–400)
Platelets: 488 10*3/uL — ABNORMAL HIGH (ref 150–400)
RBC: 3.33 MIL/uL — ABNORMAL LOW (ref 3.87–5.11)
RBC: 3.75 MIL/uL — ABNORMAL LOW (ref 3.87–5.11)
RDW: 14.9 % (ref 11.5–15.5)
RDW: 15.3 % (ref 11.5–15.5)
WBC: 10 10*3/uL (ref 4.0–10.5)
WBC: 11.6 10*3/uL — ABNORMAL HIGH (ref 4.0–10.5)

## 2010-10-28 LAB — CULTURE, ROUTINE-ABSCESS: Culture: NO GROWTH

## 2012-01-24 HISTORY — PX: OTHER SURGICAL HISTORY: SHX169

## 2012-03-22 ENCOUNTER — Encounter (HOSPITAL_COMMUNITY): Payer: Self-pay | Admitting: Pharmacist

## 2012-04-02 ENCOUNTER — Encounter (HOSPITAL_COMMUNITY): Payer: Self-pay

## 2012-04-02 ENCOUNTER — Encounter (HOSPITAL_COMMUNITY)
Admission: RE | Admit: 2012-04-02 | Discharge: 2012-04-02 | Disposition: A | Discharge status: Home / Self Care | Payer: 59 | Source: Ambulatory Visit | Attending: Obstetrics & Gynecology | Admitting: Obstetrics & Gynecology

## 2012-04-02 HISTORY — DX: Adverse effect of unspecified anesthetic, initial encounter: T41.45XA

## 2012-04-02 HISTORY — DX: Essential (primary) hypertension: I10

## 2012-04-02 LAB — BASIC METABOLIC PANEL
BUN: 13 mg/dL (ref 6–23)
CO2: 27 mEq/L (ref 19–32)
Calcium: 9.4 mg/dL (ref 8.4–10.5)
Chloride: 99 mEq/L (ref 96–112)
Creatinine, Ser: 0.78 mg/dL (ref 0.50–1.10)
GFR calc Af Amer: >90 mL/min (ref >90)
GFR calc non Af Amer: >90 mL/min (ref >90)
Glucose, Bld: 90 mg/dL (ref 70–99)
Potassium: 4.1 mEq/L (ref 3.5–5.1)
Sodium: 136 mEq/L (ref 135–145)

## 2012-04-02 LAB — CBC
HCT: 37 % (ref 36.0–46.0)
Hemoglobin: 12.4 g/dL (ref 12.0–15.0)
MCH: 28.1 pg (ref 26.0–34.0)
MCHC: 33.5 g/dL (ref 30.0–36.0)
MCV: 83.9 fL (ref 78.0–100.0)
Platelets: 312 10*3/uL (ref 150–400)
RBC: 4.41 MIL/uL (ref 3.87–5.11)
RDW: 14.4 % (ref 11.5–15.5)
WBC: 10.3 10*3/uL (ref 4.0–10.5)

## 2012-04-02 NOTE — Patient Instructions (Signed)
Your procedure is scheduled on:04/05/12  Enter through the Main Entrance at :1130 am Pick up desk phone and dial [phone] and inform us of your arrival.  Please call [phone] if you have any problems the morning of surgery.  Remember: Do not eat after midnight:Thursday Clear liquids ok until 9am Fri  Take these meds the morning of surgery with a sip of water:blood pressure pill  DO NOT wear jewelry, eye make-up, lipstick,body lotion, or dark fingernail polish. Do not shave for 48 hours prior to surgery.  If you are to be admitted after surgery, leave suitcase in car until your room has been assigned. Patients discharged on the day of surgery will not be allowed to drive home.

## 2012-04-05 ENCOUNTER — Ambulatory Visit (HOSPITAL_COMMUNITY): Payer: 59

## 2012-04-05 ENCOUNTER — Ambulatory Visit (HOSPITAL_COMMUNITY)
Admission: RE | Admit: 2012-04-05 | Discharge: 2012-04-05 | Disposition: A | Discharge status: Home / Self Care | Payer: 59 | Source: Ambulatory Visit | Attending: Obstetrics & Gynecology | Admitting: Obstetrics & Gynecology

## 2012-04-05 ENCOUNTER — Encounter (HOSPITAL_COMMUNITY): Payer: Self-pay | Admitting: Anesthesiology

## 2012-04-05 ENCOUNTER — Encounter (HOSPITAL_COMMUNITY): Payer: Self-pay

## 2012-04-05 ENCOUNTER — Encounter (HOSPITAL_COMMUNITY)
Admission: RE | Disposition: A | Discharge status: Home / Self Care | Payer: Self-pay | Source: Ambulatory Visit | Attending: Obstetrics & Gynecology

## 2012-04-05 DIAGNOSIS — N925 Other specified irregular menstruation: Secondary | ICD-10-CM | POA: Insufficient documentation

## 2012-04-05 DIAGNOSIS — N949 Unspecified condition associated with female genital organs and menstrual cycle: Secondary | ICD-10-CM | POA: Insufficient documentation

## 2012-04-05 DIAGNOSIS — N938 Other specified abnormal uterine and vaginal bleeding: Secondary | ICD-10-CM | POA: Insufficient documentation

## 2012-04-05 DIAGNOSIS — Z30433 Encounter for removal and reinsertion of intrauterine contraceptive device: Secondary | ICD-10-CM | POA: Insufficient documentation

## 2012-04-05 DIAGNOSIS — N840 Polyp of corpus uteri: Secondary | ICD-10-CM | POA: Insufficient documentation

## 2012-04-05 LAB — PREGNANCY, URINE: Preg Test, Ur: NEGATIVE

## 2012-04-05 SURGERY — DILATATION & CURETTAGE/HYSTEROSCOPY WITH TRUCLEAR
Anesthesia: General | Site: Vagina | Wound class: Clean Contaminated

## 2012-04-05 MED ORDER — KETOROLAC TROMETHAMINE 30 MG/ML IJ SOLN
INTRAMUSCULAR | Status: DC | PRN
  Administered 2012-04-05: 30 mg via INTRAVENOUS

## 2012-04-05 MED ORDER — FENTANYL CITRATE 0.05 MG/ML IJ SOLN
INTRAMUSCULAR | Status: DC | PRN
  Administered 2012-04-05 (×2): 50 ug via INTRAVENOUS
  Administered 2012-04-05: 100 ug via INTRAVENOUS

## 2012-04-05 MED ORDER — LIDOCAINE HCL 1 % IJ SOLN
INTRAMUSCULAR | Status: DC | PRN
  Administered 2012-04-05: 10 mL

## 2012-04-05 MED ORDER — ONDANSETRON HCL 4 MG/2ML IJ SOLN
INTRAMUSCULAR | Status: AC
  Filled 2012-04-05: qty 2

## 2012-04-05 MED ORDER — INDIGOTINDISULFONATE SODIUM 8 MG/ML IJ SOLN
INTRAMUSCULAR | Status: AC
  Filled 2012-04-05: qty 5

## 2012-04-05 MED ORDER — PHENYLEPHRINE HCL 10 MG/ML IJ SOLN
INTRAMUSCULAR | Status: DC | PRN
  Administered 2012-04-05 (×5): 40 ug via INTRAVENOUS

## 2012-04-05 MED ORDER — PROPOFOL 10 MG/ML IV EMUL
INTRAVENOUS | Status: DC | PRN
  Administered 2012-04-05: 150 mg via INTRAVENOUS

## 2012-04-05 MED ORDER — LIDOCAINE HCL (CARDIAC) 20 MG/ML IV SOLN
INTRAVENOUS | Status: DC | PRN
  Administered 2012-04-05: 80 mg via INTRAVENOUS

## 2012-04-05 MED ORDER — FENTANYL CITRATE 0.05 MG/ML IJ SOLN
INTRAMUSCULAR | Status: AC
  Filled 2012-04-05: qty 5

## 2012-04-05 MED ORDER — KETOROLAC TROMETHAMINE 30 MG/ML IJ SOLN
INTRAMUSCULAR | Status: AC
  Filled 2012-04-05: qty 1

## 2012-04-05 MED ORDER — FENTANYL CITRATE 0.05 MG/ML IJ SOLN: 25.0000 ug | INTRAMUSCULAR | Status: DC | PRN

## 2012-04-05 MED ORDER — DEXAMETHASONE SODIUM PHOSPHATE 10 MG/ML IJ SOLN
INTRAMUSCULAR | Status: AC
  Filled 2012-04-05: qty 1

## 2012-04-05 MED ORDER — KETOROLAC TROMETHAMINE 30 MG/ML IJ SOLN: 15.0000 mg | Freq: Once | INTRAMUSCULAR | Status: DC | PRN

## 2012-04-05 MED ORDER — EPHEDRINE 5 MG/ML INJ
INTRAVENOUS | Status: AC
  Filled 2012-04-05: qty 10

## 2012-04-05 MED ORDER — MEPERIDINE HCL 25 MG/ML IJ SOLN: 6.2500 mg | INTRAMUSCULAR | Status: DC | PRN

## 2012-04-05 MED ORDER — PROPOFOL 10 MG/ML IV EMUL
INTRAVENOUS | Status: AC
  Filled 2012-04-05: qty 20

## 2012-04-05 MED ORDER — LACTATED RINGERS IV SOLN
INTRAVENOUS | Status: DC
  Administered 2012-04-05 (×4): via INTRAVENOUS

## 2012-04-05 MED ORDER — LIDOCAINE HCL (CARDIAC) 20 MG/ML IV SOLN
INTRAVENOUS | Status: AC
  Filled 2012-04-05: qty 5

## 2012-04-05 MED ORDER — MIDAZOLAM HCL 2 MG/2ML IJ SOLN
0.5000 mg | Freq: Once | INTRAMUSCULAR | Status: DC | PRN
Start: 2012-04-05 — End: 2012-04-05

## 2012-04-05 MED ORDER — PROMETHAZINE HCL 25 MG/ML IJ SOLN: 6.2500 mg | INTRAMUSCULAR | Status: DC | PRN

## 2012-04-05 MED ORDER — MIDAZOLAM HCL 5 MG/5ML IJ SOLN
INTRAMUSCULAR | Status: DC | PRN
  Administered 2012-04-05 (×2): 1 mg via INTRAVENOUS

## 2012-04-05 MED ORDER — EPHEDRINE SULFATE 50 MG/ML IJ SOLN
INTRAMUSCULAR | Status: DC | PRN
  Administered 2012-04-05 (×2): 5 mg via INTRAVENOUS
  Administered 2012-04-05: 10 mg via INTRAVENOUS

## 2012-04-05 MED ORDER — MIDAZOLAM HCL 2 MG/2ML IJ SOLN
INTRAMUSCULAR | Status: AC
  Filled 2012-04-05: qty 2

## 2012-04-05 MED ORDER — ONDANSETRON HCL 4 MG/2ML IJ SOLN
INTRAMUSCULAR | Status: DC | PRN
  Administered 2012-04-05: 4 mg via INTRAVENOUS

## 2012-04-05 MED ORDER — OXYCODONE-ACETAMINOPHEN 5-325 MG PO TABS: 2.0000 | ORAL_TABLET | Freq: Four times a day (QID) | ORAL | Status: DC | PRN

## 2012-04-05 MED ORDER — DEXAMETHASONE SODIUM PHOSPHATE 4 MG/ML IJ SOLN
INTRAMUSCULAR | Status: DC | PRN
  Administered 2012-04-05: 4 mg via INTRAVENOUS

## 2012-04-05 MED ORDER — SODIUM CHLORIDE 0.9 % IR SOLN
Status: DC | PRN
  Administered 2012-04-05: 3000 mL

## 2012-04-05 MED ORDER — PHENYLEPHRINE 40 MCG/ML (10ML) SYRINGE FOR IV PUSH (FOR BLOOD PRESSURE SUPPORT)
PREFILLED_SYRINGE | INTRAVENOUS | Status: AC
  Filled 2012-04-05: qty 5

## 2012-04-05 SURGICAL SUPPLY — 22 items
BLADE INCISOR TRUC PLUS 2.9 (ABLATOR) IMPLANT
CANISTERS HI-FLOW 3000CC (CANNISTER) ×3 IMPLANT
CATH ROBINSON RED A/P 16FR (CATHETERS) ×2 IMPLANT
CLOTH BEACON ORANGE TIMEOUT ST (SAFETY) ×2 IMPLANT
CONTAINER PREFILL 10% NBF 60ML (FORM) ×4 IMPLANT
DRAPE HYSTEROSCOPY (DRAPE) ×2 IMPLANT
DRESSING TELFA 8X3 (GAUZE/BANDAGES/DRESSINGS) ×2 IMPLANT
ELECT REM PT RETURN 9FT ADLT (ELECTROSURGICAL) ×2
ELECTRODE REM PT RTRN 9FT ADLT (ELECTROSURGICAL) IMPLANT
GLOVE BIO SURGEON STRL SZ 6.5 (GLOVE) ×2 IMPLANT
GOWN STRL REIN XL XLG (GOWN DISPOSABLE) ×4 IMPLANT
INCISOR TRUC PLUS BLADE 2.9 (ABLATOR) ×2
KIT HYSTEROSCOPY TRUCLEAR (ABLATOR) IMPLANT
MORCELLATOR RECIP TRUCLEAR 4.0 (ABLATOR) IMPLANT
NDL SPNL 20GX3.5 QUINCKE YW (NEEDLE) IMPLANT
NEEDLE SPNL 20GX3.5 QUINCKE YW (NEEDLE) ×2 IMPLANT
PACK VAGINAL MINOR WOMEN LF (CUSTOM PROCEDURE TRAY) ×2 IMPLANT
PAD OB MATERNITY 4.3X12.25 (PERSONAL CARE ITEMS) ×2 IMPLANT
SYR CONTROL 10ML LL (SYRINGE) ×2 IMPLANT
TOWEL OR 17X24 6PK STRL BLUE (TOWEL DISPOSABLE) ×4 IMPLANT
WATER STERILE IRR 1000ML POUR (IV SOLUTION) ×2 IMPLANT
mirena (Miscellaneous) ×1 IMPLANT

## 2012-04-05 NOTE — Preoperative (Signed)
Beta Blockers   Reason not to administer Beta Blockers:Not Applicable 

## 2012-04-05 NOTE — Anesthesia Preprocedure Evaluation (Signed)
Anesthesia Evaluation  Patient identified by MRN, date of birth, ID band Patient awake    Reviewed: Allergy & Precautions, H&P , Patient's Chart, lab work & pertinent test results, reviewed documented beta blocker date and time   Airway Mallampati: III TM Distance: >3 FB Neck ROM: full    Dental no notable dental hx.    Pulmonary neg pulmonary ROS,  breath sounds clear to auscultation  Pulmonary exam normal       Cardiovascular Exercise Tolerance: Good hypertension, negative cardio ROS  Rhythm:regular Rate:Normal     Neuro/Psych negative neurological ROS  negative psych ROS   GI/Hepatic negative GI ROS, Neg liver ROS,   Endo/Other  negative endocrine ROSMorbid obesity  Renal/GU negative Renal ROS     Musculoskeletal   Abdominal   Peds  Hematology negative hematology ROS (+)   Anesthesia Other Findings Hypertension     Complication of anesthesia 1997 facial swelling postop due to anesthesia per pt   Reproductive/Obstetrics negative OB ROS                           Anesthesia Physical Anesthesia Plan  ASA: III  Anesthesia Plan: General LMA   Post-op Pain Management:    Induction:   Airway Management Planned:   Additional Equipment:   Intra-op Plan:   Post-operative Plan:   Informed Consent: I have reviewed the patients History and Physical, chart, labs and discussed the procedure including the risks, benefits and alternatives for the proposed anesthesia with the patient or authorized representative who has indicated his/her understanding and acceptance.   Dental Advisory Given  Plan Discussed with: CRNA, Surgeon and Anesthesiologist  Anesthesia Plan Comments:         Anesthesia Quick Evaluation

## 2012-04-05 NOTE — Anesthesia Postprocedure Evaluation (Signed)
  Anesthesia Post-op Note  Patient: [name]  Procedure(s) Performed: Procedure(s) with comments: DILATATION & CURETTAGE/HYSTEROSCOPY WITH TRUCLEAR (N/A) - Replace Mirena IUD  Patient is awake and responsive. Pain and nausea are reasonably well controlled. Vital signs are stable and clinically acceptable. Oxygen saturation is clinically acceptable. There are no apparent anesthetic complications at this time. Patient is ready for discharge.

## 2012-04-05 NOTE — H&P (Signed)
  Chief Complaint: 47 y.o.  who presents with AUB  Details of Present Illness: See above.  A recent U/S was suspect for a polyp.  BP 125/81  Pulse 85  Temp(Src) 98.6 F (37 C) (Oral)  Resp 20  SpO2 100%  Past Medical History  Diagnosis Date  . Hypertension   . Complication of anesthesia 1997    facial swelling postop due to anesthesia per pt   History   Social History  . Marital Status: Divorced    Spouse Name: N/A    Number of Children: N/A  . Years of Education: N/A   Occupational History  . Not on file.   Social History Main Topics  . Smoking status: Never Smoker   . Smokeless tobacco: Not on file  . Alcohol Use: Yes     Comment: occasionally  . Drug Use: No  . Sexually Active: Not on file   Other Topics Concern  . Not on file   Social History Narrative  . No narrative on file   No family history on file.  Pertinent items are noted in HPI.  Pre-Op Diagnosis: Endometrial poylp 58558   Planned Procedure: Procedure(s): DILATATION & CURETTAGE/HYSTEROSCOPY WITH TRUCLEAR/MIRENA IUD INSERTION  I have reviewed the patient's history and have completed the physical exam and [name] is acceptable for surgery.  Roseanna Rainbow, MD 04/05/2012 7:32 AM

## 2012-04-05 NOTE — Transfer of Care (Signed)
Immediate Anesthesia Transfer of Care Note  Patient: [name]  Procedure(s) Performed: Procedure(s) with comments: DILATATION & CURETTAGE/HYSTEROSCOPY WITH TRUCLEAR (N/A) - Replace Mirena IUD  Patient Location: PACU  Anesthesia Type:General  Level of Consciousness: awake, alert , oriented and patient cooperative  Airway & Oxygen Therapy: Patient Spontanous Breathing and Patient connected to nasal cannula oxygen  Post-op Assessment: Report given to PACU RN and Post -op Vital signs reviewed and stable  Post vital signs: Reviewed and stable  Complications: No apparent anesthesia complications

## 2012-04-05 NOTE — Op Note (Signed)
Preoperative diagnosis: AUB-P  Postoperative diagnosis: same  Procedure: Operatve hysteroscopy with polypectomy using the Truclear morcellator,  dilatation and curettage, Mirena IUD removal/insertion  Surgeon: Antionette Char A  Anesthesia: LMA/paracervical block  Estimated blood loss: minimal  Urine output: per Anesthesiology  IV Fluids: per Anesthesiology  Complications: none  Specimen: PATHOLOGY  Operative Findings: The endometrium was atrophic appearing.  "Bifid" shaped, polyp arising from the left sidewall.  The tubal ostia were well visualized.  Description of procedure:   The patient was taken to the operating room and placed on the operating table in the semi-lithotomy position in [place] stirrups.  Examination under anesthesia was performed.  The patient was prepped and draped in the usual manner.  After a time-out had been completed, a speculum was placed in the vagina.  The anterior lip of the cervix was grasped with a single-toothed tenaculum.    10 cc of 1% lidocaine were injected at 4 and 7 o'clock to produce a paracervical block.  The uterine cavity sounded to 8 cm.  The endocervical canal was dilated with Shawnie Pons dilators.  A  diagnostic hysteroscope with Glycine as the distending medium was used to perform a diagnostic hysteroscopy.  The findings are noted above.    The morcellator was introduced through the operative port and the above noted lesion was morcellated.  Curetings were obtained with the morcellator.   A small, Sims curette was used to perform an endometrial curettage.  A Mirena IUD was placed.  All the instruments were removed from the vagina.  Final instrument counts were correct.  The patient was taken to the PACU in stable condition.

## 2012-05-20 ENCOUNTER — Ambulatory Visit (INDEPENDENT_AMBULATORY_CARE_PROVIDER_SITE_OTHER): Payer: 59 | Admitting: Obstetrics & Gynecology

## 2012-05-20 ENCOUNTER — Encounter: Payer: Self-pay | Admitting: Obstetrics & Gynecology

## 2012-05-20 VITALS — BP 130/75 | HR 80 | Temp 98.6°F | Ht 64.0 in | Wt 216.0 lb

## 2012-05-20 DIAGNOSIS — B373 Candidiasis of vulva and vagina: Secondary | ICD-10-CM

## 2012-05-20 MED ORDER — FLUCONAZOLE 150 MG PO TABS: 150.0000 mg | ORAL_TABLET | Freq: Once | ORAL | Status: DC

## 2012-05-20 NOTE — Progress Notes (Signed)
Subjective:     [name] is a 47 y.o. female who presents to the clinic 6 weeks status post hysterscopy and placement of a MIrena IUD for removal of a endometrial poylp. Eating a regular diet without difficulty. Bowel movements are normal. The patient is not having any pain.  The following portions of the patient's history were reviewed and updated as appropriate: allergies, current medications, past family history, past medical history, past social history, past surgical history and problem list.  Review of Systems Genitourinary:positive for vaginal discharge    Objective:    BP 130/75  Pulse 80  Temp(Src) 98.6 F (37 C)  Ht 5\' 4"  (1.626 m)  Wt 216 lb (97.977 kg)  BMI 37.06 kg/m2     General:  alert     Abdomen: soft, non-tender; bowel sounds normal; no masses,  no organomegaly   Vulva:  normal  Vagina: normal vagina  Cervix:  no lesions; IUD strings present  Corpus: normal size, contour, position, consistency, mobility, non-tender  Adnexa:  normal adnexa   Assessment:    Doing well postoperatively. Operative findings again reviewed. Pathology report discussed.  Likely candida vulvovaginitis   Plan:     Continue any current medications.   Follow up: 6 months

## 2012-05-21 ENCOUNTER — Encounter: Payer: Self-pay | Admitting: Obstetrics & Gynecology

## 2012-05-21 NOTE — Patient Instructions (Signed)
Vaginitis  Vaginitis is an infection. It causes soreness, swelling, and redness (inflammation) of the vagina. Many of these infections are sexually transmitted diseases (STDs). Having unprotected sex can cause further problems and complications such as:   Chronic pelvic pain.   Infertility.   Unwanted pregnancy.   Abortion.   Tubal pregnancy.   Infection passed on to the newborn.   Cancer.  CAUSES    Monilia. This is a yeast or fungus infection, not an STD.   Bacterial vaginosis. The normal balance of bacteria in the vagina is disrupted and is replaced by an overgrowth of certain bacteria.   Gonorrhea, chlamydia. These are bacterial infections that are STDs.   Vaginal sponges, diaphragms, and intrauterine devices.   Trichomoniasis. This is a STD infection caused by a parasite.   Viruses like herpes and human papillomavirus. Both are STDs.   Pregnancy.   Immunosuppression. This occurs with certain conditions such as HIV infection or cancer.   Using bubble bath.   Taking certain antibiotic medicines.   Sporadic recurrence can occur if you become sick.   Diabetes.   Steroids.   Allergic reaction. If you have an allergy to:   Douches.   Soaps.   Spermicides.   Condoms.   Scented tampons or vaginal sprays.  SYMPTOMS    Abnormal vaginal discharge.   Itching of the vagina.   Pain in the vagina.   Swelling of the vagina.  In some cases, there are no symptoms.  TREATMENT   Treatment will vary depending on the type of infection.   Bacteria or trichomonas are usually treated with oral antibiotics and sometimes vaginal cream or suppositories.   Monilia vaginitis is usually treated with vaginal creams, suppositories, or oral antifungal pills.   Viral vaginitis has no cure. However, the symptoms of herpes (a viral vaginitis) can be treated to relieve the discomfort. Human papillomavirus has no symptoms. However, there are treatments for the diseases caused by human papillomavirus.   With allergic  vaginitis, you need to stop using the product that is causing the problem. Vaginal creams can be used to treat the symptoms.   When treating an STD, the sex partner should also be treated.  HOME CARE INSTRUCTIONS    Take all the medicines as directed by your caregiver.   Do not use scented tampons, soaps, or vaginal sprays.   Do not douche.   Tell your sex partner if you have a vaginal infection or an STD.   Do not have sexual intercourse until you have treated the vaginitis.   Practice safe sex by using condoms.  SEEK MEDICAL CARE IF:    You have abdominal pain.   Your symptoms get worse during treatment.  Document Released: 11/06/2006 Document Revised: 04/03/2011 Document Reviewed: 07/02/2008  Exit[employer] Patient Information 2013 [employer].

## 2012-05-22 ENCOUNTER — Encounter: Payer: Self-pay | Admitting: Obstetrics & Gynecology

## 2012-12-11 ENCOUNTER — Ambulatory Visit: Payer: 59 | Admitting: Obstetrics & Gynecology

## 2013-02-05 ENCOUNTER — Ambulatory Visit: Payer: 59 | Admitting: Obstetrics

## 2014-02-08 ENCOUNTER — Emergency Department (HOSPITAL_COMMUNITY)
Admission: EM | Admit: 2014-02-08 | Discharge: 2014-02-08 | Disposition: A | Discharge status: Home / Self Care | Payer: Federal, State, Local not specified - PPO | Attending: Emergency Medicine | Admitting: Emergency Medicine

## 2014-02-08 ENCOUNTER — Encounter (HOSPITAL_COMMUNITY): Payer: Self-pay | Admitting: *Deleted

## 2014-02-08 DIAGNOSIS — E780 Pure hypercholesterolemia: Secondary | ICD-10-CM | POA: Diagnosis not present

## 2014-02-08 DIAGNOSIS — M545 Low back pain, unspecified: Secondary | ICD-10-CM

## 2014-02-08 DIAGNOSIS — Z79899 Other long term (current) drug therapy: Secondary | ICD-10-CM | POA: Diagnosis not present

## 2014-02-08 DIAGNOSIS — I10 Essential (primary) hypertension: Secondary | ICD-10-CM | POA: Insufficient documentation

## 2014-02-08 DIAGNOSIS — R109 Unspecified abdominal pain: Secondary | ICD-10-CM | POA: Diagnosis present

## 2014-02-08 DIAGNOSIS — R102 Pelvic and perineal pain: Secondary | ICD-10-CM | POA: Diagnosis not present

## 2014-02-08 HISTORY — DX: Pure hypercholesterolemia, unspecified: E78.00

## 2014-02-08 LAB — WET PREP, GENITAL
Trich, Wet Prep: NONE SEEN
Yeast Wet Prep HPF POC: NONE SEEN

## 2014-02-08 LAB — URINALYSIS, ROUTINE W REFLEX MICROSCOPIC
Bilirubin Urine: NEGATIVE
Glucose, UA: NEGATIVE mg/dL
Hgb urine dipstick: NEGATIVE
Ketones, ur: 15 mg/dL — AB
Leukocytes, UA: NEGATIVE
Nitrite: NEGATIVE
Protein, ur: NEGATIVE mg/dL
Specific Gravity, Urine: 1.038 — ABNORMAL HIGH (ref 1.005–1.030)
Urobilinogen, UA: 0.2 mg/dL (ref 0.0–1.0)
pH: 5.5 (ref 5.0–8.0)

## 2014-02-08 MED ORDER — AZITHROMYCIN 250 MG PO TABS: 1000.0000 mg | ORAL_TABLET | Freq: Once | ORAL | Status: DC

## 2014-02-08 MED ORDER — IBUPROFEN 400 MG PO TABS
800.0000 mg | ORAL_TABLET | Freq: Once | ORAL | Status: AC
  Administered 2014-02-08: 800 mg via ORAL
  Filled 2014-02-08: qty 2

## 2014-02-08 MED ORDER — IBUPROFEN 800 MG PO TABS: 800.0000 mg | ORAL_TABLET | Freq: Three times a day (TID) | ORAL | Status: DC | PRN

## 2014-02-08 MED ORDER — CEFTRIAXONE SODIUM 250 MG IJ SOLR: 250.0000 mg | Freq: Once | INTRAMUSCULAR | Status: DC

## 2014-02-08 NOTE — ED Notes (Signed)
PA at the bedside.

## 2014-02-08 NOTE — ED Provider Notes (Deleted)
Pt here with low back pain but more importantly worsen pelvic pain and abdominal pain.  She will need a more thorough evaluation including pelvic exam.    Domenic Moras, PA-C 02/08/14 1433

## 2014-02-08 NOTE — ED Notes (Signed)
Declined W/C at D/C and was escorted to lobby by RN. 

## 2014-02-08 NOTE — Discharge Instructions (Signed)
Please follow up closely with your doctor for further evaluation of your low back pain and abdominal pain.  Return if you develop fever, worsening pain, vomiting or diarrhea.     Abdominal Pain, Women Abdominal (stomach, pelvic, or belly) pain can be caused by many things. It is important to tell your doctor:  The location of the pain.  Does it come and go or is it present all the time?  Are there things that start the pain (eating certain foods, exercise)?  Are there other symptoms associated with the pain (fever, nausea, vomiting, diarrhea)? All of this is helpful to know when trying to find the cause of the pain. CAUSES   Stomach: virus or bacteria infection, or ulcer.  Intestine: appendicitis (inflamed appendix), regional ileitis (Crohn's disease), ulcerative colitis (inflamed colon), irritable bowel syndrome, diverticulitis (inflamed diverticulum of the colon), or cancer of the stomach or intestine.  Gallbladder disease or stones in the gallbladder.  Kidney disease, kidney stones, or infection.  Pancreas infection or cancer.  Fibromyalgia (pain disorder).  Diseases of the female organs:  Uterus: fibroid (non-cancerous) tumors or infection.  Fallopian tubes: infection or tubal pregnancy.  Ovary: cysts or tumors.  Pelvic adhesions (scar tissue).  Endometriosis (uterus lining tissue growing in the pelvis and on the pelvic organs).  Pelvic congestion syndrome (female organs filling up with blood just before the menstrual period).  Pain with the menstrual period.  Pain with ovulation (producing an egg).  Pain with an IUD (intrauterine device, birth control) in the uterus.  Cancer of the female organs.  Functional pain (pain not caused by a disease, may improve without treatment).  Psychological pain.  Depression. DIAGNOSIS  Your doctor will decide the seriousness of your pain by doing an examination.  Blood tests.  X-rays.  Ultrasound.  CT scan  (computed tomography, special type of X-ray).  MRI (magnetic resonance imaging).  Cultures, for infection.  Barium enema (dye inserted in the large intestine, to better view it with X-rays).  Colonoscopy (looking in intestine with a lighted tube).  Laparoscopy (minor surgery, looking in abdomen with a lighted tube).  Major abdominal exploratory surgery (looking in abdomen with a large incision). TREATMENT  The treatment will depend on the cause of the pain.   Many cases can be observed and treated at home.  Over-the-counter medicines recommended by your caregiver.  Prescription medicine.  Antibiotics, for infection.  Birth control pills, for painful periods or for ovulation pain.  Hormone treatment, for endometriosis.  Nerve blocking injections.  Physical therapy.  Antidepressants.  Counseling with a psychologist or psychiatrist.  Minor or major surgery. HOME CARE INSTRUCTIONS   Do not take laxatives, unless directed by your caregiver.  Take over-the-counter pain medicine only if ordered by your caregiver. Do not take aspirin because it can cause an upset stomach or bleeding.  Try a clear liquid diet (broth or water) as ordered by your caregiver. Slowly move to a bland diet, as tolerated, if the pain is related to the stomach or intestine.  Have a thermometer and take your temperature several times a day, and record it.  Bed rest and sleep, if it helps the pain.  Avoid sexual intercourse, if it causes pain.  Avoid stressful situations.  Keep your follow-up appointments and tests, as your caregiver orders.  If the pain does not go away with medicine or surgery, you may try:  Acupuncture.  Relaxation exercises (yoga, meditation).  Group therapy.  Counseling. SEEK MEDICAL CARE IF:   You  notice certain foods cause stomach pain.  Your home care treatment is not helping your pain.  You need stronger pain medicine.  You want your IUD removed.  You  feel faint or lightheaded.  You develop nausea and vomiting.  You develop a rash.  You are having side effects or an allergy to your medicine. SEEK IMMEDIATE MEDICAL CARE IF:   Your pain does not go away or gets worse.  You have a fever.  Your pain is felt only in portions of the abdomen. The right side could possibly be appendicitis. The left lower portion of the abdomen could be colitis or diverticulitis.  You are passing blood in your stools (bright red or black tarry stools, with or without vomiting).  You have blood in your urine.  You develop chills, with or without a fever.  You pass out. MAKE SURE YOU:   Understand these instructions.  Will watch your condition.  Will get help right away if you are not doing well or get worse. Document Released: 11/06/2006 Document Revised: 05/26/2013 Document Reviewed: 11/26/2008 [facility] Patient Information 2015 [place], [place]. This information is not intended to replace advice given to you by your health care provider. Make sure you discuss any questions you have with your health care provider.

## 2014-02-08 NOTE — ED Provider Notes (Signed)
CSN: [id number]     Arrival date & time 02/08/14  1340 History  This chart was scribed for Domenic Moras, PA-C, working with Houston Siren III, * by Steva Colder, ED Scribe. The patient was seen in room TR11C/TR11C at 2:59 PM.    Chief Complaint  Patient presents with  . Abdominal Pain    The history is provided by the patient. No language interpreter was used.    HPI Comments: [name] is a 49 y.o. female who presents to the Emergency Department complaining of sharp stabbing abdominal pain onset 1 month ago. She noticed low back pain that radiates into her lower abdomen. She has had an episode of similar pain in the past. She had a uterine polyp removed 1 year ago. This current episode has been continuous for one month and that is why she is here today.  She states that she is having associated symptoms of back pain. 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 She denies fever, n/v/d, dysuria, hematuria, chills, night sweats, recent weight loss, vaginal discharge, vaginal bleeding, vaginal itching and any other symptoms. She is sexually active with one monogamous partner. Denies hx of gonorrhea/chlyamydia BV, trich, or yeast infection.   Past Medical History  Diagnosis Date  . Hypertension   . Complication of anesthesia 1997    facial swelling postop due to anesthesia per pt  . High cholesterol    Past Surgical History  Procedure Laterality Date  . Cholecystectomy    . Dilation and curettage of uterus    . Breast surgery    . Cyst excision      from scalp  . Hysterscopy  2014   Family History  Problem Relation Age of Onset  . Diabetes Mother   . Hypertension Mother   . Diabetes Father   . Hypertension Father    History  Substance Use Topics  . Smoking status: Never Smoker   . Smokeless tobacco: Not on file  . Alcohol Use: Yes     Comment: occasionally   OB History    No data available     Review of Systems  Constitutional:  Negative for fever, chills, diaphoresis and unexpected weight change.  Gastrointestinal: Positive for abdominal pain. Negative for nausea, vomiting and diarrhea.  Genitourinary: Negative for dysuria, hematuria, vaginal bleeding and vaginal discharge.  Musculoskeletal: Positive for back pain.      Allergies  Review of patient's allergies indicates no known allergies.  Home Medications   Prior to Admission medications   Medication Sig Start Date End Date Taking? Authorizing Provider  fluconazole (DIFLUCAN) 150 MG tablet Take 1 tablet (150 mg total) by mouth once. 05/20/12   Lahoma Crocker, MD  Olmesartan-Amlodipine-HCTZ [facility]) 40-5-12.5 MG TABS Take 1 tablet by mouth daily.    Historical Provider, MD  oxyCODONE-acetaminophen (PERCOCET) 5-325 MG per tablet Take 2 tablets by mouth every 6 (six) hours as needed for pain. 04/05/12   Lahoma Crocker, MD  simvastatin (ZOCOR) 40 MG tablet Take 40 mg by mouth every evening.    Historical Provider, MD   BP 134/86 mmHg  Pulse 102  Temp(Src) 98.2 F (36.8 C) (Oral)  Resp 18  Ht 5\' 4"  (1.626 m)  Wt 209 lb (94.802 kg)  BMI 35.86 kg/m2  SpO2 98%  Physical Exam  Constitutional: She is oriented to person, place, and time. She appears well-developed and well-nourished. No distress.  HENT:  Head: Normocephalic and atraumatic.  Eyes: EOM  are normal.  Neck: Neck supple. No tracheal deviation present.  Cardiovascular: Normal rate.   Pulmonary/Chest: Effort normal. No respiratory distress.  Abdominal: Soft. Bowel sounds are normal. There is tenderness. There is no rebound, no guarding and no CVA tenderness.  suprapubic abdomen tenderness upon palpation. No CVA tenderness. Ambulates without difficulty.   Genitourinary: There is no rash or lesion on the right labia. There is no rash or lesion on the left labia. No tenderness in the vagina.  No inguinal lymph adenopathy. No rash or skin lesions noted. Cervical os visualized and closed.  Moderate functional discharge noted in the vaginal vault. Nl external genitalia.  Bimanual examination: No cervical motion tenderness or adnexal tenderness. Exam is limited due to large body habitus.  Musculoskeletal: Normal range of motion.  TTP of the low back/ coccyx region.   Lymphadenopathy:       Right: No inguinal adenopathy present.       Left: No inguinal adenopathy present.  Neurological: She is alert and oriented to person, place, and time.  Skin: Skin is warm and dry.  Psychiatric: She has a normal mood and affect. Her behavior is normal.  Nursing note and vitals reviewed.   ED Course  Procedures (including critical care time) DIAGNOSTIC STUDIES: Oxygen Saturation is 98% on room air, normal by my interpretation.    COORDINATION OF CARE: 3:22 PM-patient here with persistent low back pain for more than a month and now having some pelvic pain as well. She does have some paralumbar spinal muscle tenderness on exam.  Her pelvic examination is without any signs to suggest PID. Discussed treatment plan which includes UA, wet prep, ibuprofen, with pt at bedside and pt agreed to plan.   4:06 PM Pt has persistent low back pain.  She also report some low pelvic pain.  She has no significant point tenderness on exam.  She is afebrile, vss.  No peritoneal sign.  She has a PCP which she is willing to f/u closely.  At this time, no acute emergent condition identified.  Return precaution discussed.     Labs Review Labs Reviewed  WET PREP, GENITAL - Abnormal; Notable for the following:    Clue Cells Wet Prep HPF POC FEW (*)    WBC, Wet Prep HPF POC RARE (*)    All other components within normal limits  URINALYSIS, ROUTINE W REFLEX MICROSCOPIC - Abnormal; Notable for the following:    APPearance CLOUDY (*)    Specific Gravity, Urine 1.038 (*)    Ketones, ur 15 (*)    All other components within normal limits  GC/CHLAMYDIA PROBE AMP ([place])    Imaging Review No results  found.   EKG Interpretation None      MDM   Final diagnoses:  Bilateral low back pain without sciatica  Pelvic pain in female    BP 125/87 mmHg  Pulse 91  Temp(Src) 99.6 F (37.6 C) (Oral)  Resp 16  Ht 5\' 4"  (1.626 m)  Wt 209 lb (94.802 kg)  BMI 35.86 kg/m2  SpO2 98%   I personally performed the services described in this documentation, which was scribed in my presence. The recorded information has been reviewed and is accurate.    Domenic Moras, PA-C 02/08/14 [place], MD 02/14/14 [phone]

## 2014-02-08 NOTE — ED Notes (Signed)
Pt reports lower back pain, hx of same but this time not going away. Pain is mid lower back, into her tailbone and into bilateral hips and legs. Pt ambulatory at triage.

## 2014-02-09 LAB — GC/CHLAMYDIA PROBE AMP (~~LOC~~) NOT AT ARMC
Chlamydia: NEGATIVE
Neisseria Gonorrhea: NEGATIVE

## 2015-08-25 ENCOUNTER — Other Ambulatory Visit: Payer: Self-pay | Admitting: Family Medicine

## 2015-08-26 ENCOUNTER — Other Ambulatory Visit: Payer: Self-pay | Admitting: Family Medicine

## 2015-08-26 DIAGNOSIS — G5701 Lesion of sciatic nerve, right lower limb: Secondary | ICD-10-CM

## 2015-09-05 ENCOUNTER — Ambulatory Visit
Admission: RE | Admit: 2015-09-05 | Discharge: 2015-09-05 | Disposition: A | Discharge status: Home / Self Care | Payer: Federal, State, Local not specified - PPO | Source: Ambulatory Visit | Attending: Family Medicine | Admitting: Family Medicine

## 2015-09-05 DIAGNOSIS — G5701 Lesion of sciatic nerve, right lower limb: Secondary | ICD-10-CM

## 2015-09-05 MED ORDER — GADOBENATE DIMEGLUMINE 529 MG/ML IV SOLN
20.0000 mL | Freq: Once | INTRAVENOUS | Status: AC | PRN
  Administered 2015-09-05: 20 mL via INTRAVENOUS

## 2015-11-24 DIAGNOSIS — M5416 Radiculopathy, lumbar region: Secondary | ICD-10-CM | POA: Diagnosis not present

## 2015-11-24 DIAGNOSIS — M545 Low back pain: Secondary | ICD-10-CM | POA: Diagnosis not present

## 2015-11-29 DIAGNOSIS — M5416 Radiculopathy, lumbar region: Secondary | ICD-10-CM | POA: Diagnosis not present

## 2015-11-29 DIAGNOSIS — M545 Low back pain: Secondary | ICD-10-CM | POA: Diagnosis not present

## 2015-12-01 DIAGNOSIS — M5416 Radiculopathy, lumbar region: Secondary | ICD-10-CM | POA: Diagnosis not present

## 2015-12-01 DIAGNOSIS — M545 Low back pain: Secondary | ICD-10-CM | POA: Diagnosis not present

## 2015-12-08 DIAGNOSIS — M5416 Radiculopathy, lumbar region: Secondary | ICD-10-CM | POA: Diagnosis not present

## 2015-12-08 DIAGNOSIS — M545 Low back pain: Secondary | ICD-10-CM | POA: Diagnosis not present

## 2016-05-02 ENCOUNTER — Other Ambulatory Visit (HOSPITAL_COMMUNITY)
Admission: RE | Admit: 2016-05-02 | Discharge: 2016-05-02 | Disposition: A | Discharge status: Home / Self Care | Payer: 59 | Source: Ambulatory Visit | Attending: Family Medicine | Admitting: Family Medicine

## 2016-05-02 ENCOUNTER — Other Ambulatory Visit: Payer: Self-pay | Admitting: Family Medicine

## 2016-05-02 DIAGNOSIS — Z01411 Encounter for gynecological examination (general) (routine) with abnormal findings: Secondary | ICD-10-CM | POA: Diagnosis present

## 2016-05-02 DIAGNOSIS — Z1151 Encounter for screening for human papillomavirus (HPV): Secondary | ICD-10-CM | POA: Insufficient documentation

## 2016-05-05 LAB — CYTOLOGY - PAP
Adequacy: ABSENT
Diagnosis: NEGATIVE
HPV: NOT DETECTED

## 2017-02-19 DIAGNOSIS — N62 Hypertrophy of breast: Secondary | ICD-10-CM | POA: Diagnosis not present

## 2017-02-23 DIAGNOSIS — M5416 Radiculopathy, lumbar region: Secondary | ICD-10-CM | POA: Diagnosis not present

## 2017-03-15 DIAGNOSIS — M5416 Radiculopathy, lumbar region: Secondary | ICD-10-CM | POA: Diagnosis not present

## 2017-04-05 DIAGNOSIS — M5416 Radiculopathy, lumbar region: Secondary | ICD-10-CM | POA: Diagnosis not present

## 2017-04-25 DIAGNOSIS — M5416 Radiculopathy, lumbar region: Secondary | ICD-10-CM | POA: Diagnosis not present

## 2017-05-09 DIAGNOSIS — Z1231 Encounter for screening mammogram for malignant neoplasm of breast: Secondary | ICD-10-CM | POA: Diagnosis not present

## 2017-05-16 DIAGNOSIS — N62 Hypertrophy of breast: Secondary | ICD-10-CM | POA: Diagnosis not present

## 2017-05-24 ENCOUNTER — Other Ambulatory Visit: Payer: Self-pay | Admitting: Plastic Surgery

## 2017-05-24 DIAGNOSIS — N6012 Diffuse cystic mastopathy of left breast: Secondary | ICD-10-CM | POA: Diagnosis not present

## 2017-05-24 DIAGNOSIS — N62 Hypertrophy of breast: Secondary | ICD-10-CM | POA: Diagnosis not present

## 2017-05-24 DIAGNOSIS — N6011 Diffuse cystic mastopathy of right breast: Secondary | ICD-10-CM | POA: Diagnosis not present

## 2017-06-26 DIAGNOSIS — I10 Essential (primary) hypertension: Secondary | ICD-10-CM | POA: Diagnosis not present

## 2017-06-26 DIAGNOSIS — E785 Hyperlipidemia, unspecified: Secondary | ICD-10-CM | POA: Diagnosis not present

## 2017-06-26 DIAGNOSIS — E119 Type 2 diabetes mellitus without complications: Secondary | ICD-10-CM | POA: Diagnosis not present

## 2017-08-15 DIAGNOSIS — E669 Obesity, unspecified: Secondary | ICD-10-CM | POA: Diagnosis not present

## 2017-08-15 DIAGNOSIS — I10 Essential (primary) hypertension: Secondary | ICD-10-CM | POA: Diagnosis not present

## 2017-08-15 DIAGNOSIS — E1169 Type 2 diabetes mellitus with other specified complication: Secondary | ICD-10-CM | POA: Diagnosis not present

## 2017-08-15 DIAGNOSIS — M545 Low back pain: Secondary | ICD-10-CM | POA: Diagnosis not present

## 2017-09-12 ENCOUNTER — Encounter: Payer: Self-pay | Admitting: Skilled Nursing Facility1

## 2017-09-12 ENCOUNTER — Encounter: Payer: Federal, State, Local not specified - PPO | Attending: General Surgery | Admitting: Skilled Nursing Facility1

## 2017-09-12 DIAGNOSIS — I10 Essential (primary) hypertension: Secondary | ICD-10-CM | POA: Insufficient documentation

## 2017-09-12 DIAGNOSIS — E7800 Pure hypercholesterolemia, unspecified: Secondary | ICD-10-CM | POA: Diagnosis not present

## 2017-09-12 DIAGNOSIS — E1169 Type 2 diabetes mellitus with other specified complication: Secondary | ICD-10-CM | POA: Diagnosis not present

## 2017-09-12 DIAGNOSIS — Z713 Dietary counseling and surveillance: Secondary | ICD-10-CM | POA: Diagnosis not present

## 2017-09-12 DIAGNOSIS — M545 Low back pain: Secondary | ICD-10-CM | POA: Diagnosis not present

## 2017-09-12 DIAGNOSIS — Z6836 Body mass index (BMI) 36.0-36.9, adult: Secondary | ICD-10-CM | POA: Diagnosis not present

## 2017-09-12 DIAGNOSIS — F509 Eating disorder, unspecified: Secondary | ICD-10-CM | POA: Diagnosis not present

## 2017-09-12 DIAGNOSIS — E669 Obesity, unspecified: Secondary | ICD-10-CM

## 2017-09-12 NOTE — Progress Notes (Signed)
Pre-Op Assessment Visit:  Pre-Operative Sleeve Surgery  Medical Nutrition Therapy:  Appt start time: 9:00  End time:  10:00  Patient was seen on 09/12/2017 for Pre-Operative Nutrition Assessment. Assessment and letter of approval faxed to [facility] Bariatric Surgery Program coordinator on 09/12/2017.   Pts referral states she needs 4 SWL.  Pt states she has gotten rid of the guilt feeling after eating and just enjoys whatever she is eating. Pt states she tried to get up at her desk as often as she can at work. Pt states she has a great support system in her brother and sister. Pt states her brother is making dietary changes with her. Pt states she is recognizing she was not able to stick with dietary changes because they were too restrictive so she now has more freedom in how she eats so she can stick with it.  Pt has a well balanced diet and is activity working on her relationship with food. Pt states she will see how these 4 months go and decide if she feels she needs surgery at the end of them.   Pt expectation of surgery: to get off medications   Pt expectation of Dietitian: none stated   Start weight at [facility]: 218.3 BMI: 36.33  24 hr Dietary Recall: First Meal: 2 eggs and yogurt or peanutbutter toast and fruit Snack: almonds or fruit Second Meal: grilled chicken sala dor tuna salad Snack: fruit or yogurt Third Meal: baked chicken and vegetable and salad or fish Snack:  Beverages: water 64 ounces, crystal light in water  Encouraged to engage in 150 minutes of moderate physical activity including cardiovascular and weight baring weekly  Handouts given during visit include:  . Pre-Op Goals . Bariatric Surgery Protein Shakes During the appointment today the following Pre-Op Goals were reviewed with the patient: . Maintain or lose weight as instructed by your surgeon . Make healthy food choices . Begin to limit portion sizes . Limited concentrated sugars and fried  foods . Keep fat/sugar in the single digits per serving on             food labels . Practice CHEWING your food  (aim for 30 chews per bite or until applesauce consistency) . Practice not drinking 15 minutes before, during, and 30 minutes after each meal/snack . Avoid all carbonated beverages  . Avoid/limit caffeinated beverages  . Avoid all sugar-sweetened beverages . Consume 3 meals per day; eat every 3-5 hours . Make a list of non-food related activities . Aim for 64-100 ounces of FLUID daily  . Aim for at least 60-80 grams of PROTEIN daily . Look for a liquid protein source that contain ?15 g protein and ?5 g carbohydrate  (ex: shakes, drinks, shots) . Physical activity: 3 days a week tapes after work for 30 minutes   -Follow diet recommendations listed below   Energy and Macronutrient Recomendations: Calories: 1500 Carbohydrate: 170 Protein: 112 Fat: 42  Demonstrated degree of understanding via:  Teach Back  Teaching Method Utilized:  Visual Auditory Hands on  Barriers to learning/adherence to lifestyle change: none identified   Patient to call the [facility] to enroll in Pre-Op and Post-Op Nutrition Education when surgery date is scheduled.

## 2017-10-10 ENCOUNTER — Encounter: Payer: Self-pay | Admitting: Skilled Nursing Facility1

## 2017-10-10 ENCOUNTER — Encounter: Payer: Federal, State, Local not specified - PPO | Attending: General Surgery | Admitting: Skilled Nursing Facility1

## 2017-10-10 DIAGNOSIS — Z713 Dietary counseling and surveillance: Secondary | ICD-10-CM | POA: Diagnosis not present

## 2017-10-10 DIAGNOSIS — E669 Obesity, unspecified: Secondary | ICD-10-CM

## 2017-10-10 DIAGNOSIS — E1169 Type 2 diabetes mellitus with other specified complication: Secondary | ICD-10-CM | POA: Insufficient documentation

## 2017-10-10 DIAGNOSIS — M545 Low back pain: Secondary | ICD-10-CM | POA: Diagnosis not present

## 2017-10-10 DIAGNOSIS — E7800 Pure hypercholesterolemia, unspecified: Secondary | ICD-10-CM | POA: Diagnosis not present

## 2017-10-10 DIAGNOSIS — I10 Essential (primary) hypertension: Secondary | ICD-10-CM | POA: Diagnosis not present

## 2017-10-10 DIAGNOSIS — Z6836 Body mass index (BMI) 36.0-36.9, adult: Secondary | ICD-10-CM | POA: Diagnosis not present

## 2017-10-10 NOTE — Progress Notes (Signed)
Sleeve Assessment:  1st SWL Appointment.   Pts referral states she needs 4 SWL.  Pt states if she does well throughout the process she may not have surgery.  Pt states she has been walking 10000 steps a day and working out 3 days a  Week: aerobic video. Pt states she has been experiencing leg cramping: she does take hydrochlorothiazide. Pt states she did have diarrhea last week.  Pt is doing fantastic.   Start Wt at [facility]: 218.3 Wt:219.3 BMI: 36.49  MEDICATIONS: See List   DIETARY INTAKE:  24-hr recall:  B ( AM): wheat toast with peanut butter and banana Snk ( AM): protein bar and fruit L ( PM): tuna sandwich and apple Snk ( PM):grapes D ( PM): grilled chicken salad Snk ( PM): sometimes apple Beverages: water with crystal light   Usual physical activity: 10000 steps and 3 days a week aerobic video   -Follow diet recommendations listed below   Energy and Macronutrient Recomendations: Calories: 1500 Carbohydrate: 170 Protein: 112 Fat: 42   Nutritional Diagnosis:  [place]-3.3 Overweight/obesity related to past poor dietary habits and physical inactivity as evidenced by patient w/ planned sleeve surgery following dietary guidelines for continued weight loss.    Intervention:  Nutrition counseling for upcoming Bariatric Surgery. Goals: -Encouraged to engage in 150 minutes of moderate physical activity including cardiovascular and weight baring weekly -Add drinking to your list and a phone reminder: aiming for 64 ounces  -Keep up the great activity!!  -Eat half your protein bar  Teaching Method Utilized:  Visual Auditory Hands on  Barriers to learning/adherence to lifestyle change: none identified   Demonstrated degree of understanding via:  Teach Back   Monitoring/Evaluation:  Dietary intake, exercise, and body weight prn.

## 2017-10-10 NOTE — Patient Instructions (Addendum)
-  Add drinking to your list and a phone reminder: aiming for 64 ounces   -Keep up the great activity!!   -Eat half your protein bar

## 2017-10-24 DIAGNOSIS — N62 Hypertrophy of breast: Secondary | ICD-10-CM | POA: Diagnosis not present

## 2017-10-31 DIAGNOSIS — Z23 Encounter for immunization: Secondary | ICD-10-CM | POA: Diagnosis not present

## 2017-11-08 ENCOUNTER — Encounter: Payer: Self-pay | Admitting: Skilled Nursing Facility1

## 2017-11-08 ENCOUNTER — Encounter: Payer: Federal, State, Local not specified - PPO | Attending: General Surgery | Admitting: Skilled Nursing Facility1

## 2017-11-08 DIAGNOSIS — M545 Low back pain: Secondary | ICD-10-CM | POA: Diagnosis not present

## 2017-11-08 DIAGNOSIS — Z6836 Body mass index (BMI) 36.0-36.9, adult: Secondary | ICD-10-CM | POA: Insufficient documentation

## 2017-11-08 DIAGNOSIS — I10 Essential (primary) hypertension: Secondary | ICD-10-CM | POA: Insufficient documentation

## 2017-11-08 DIAGNOSIS — E1169 Type 2 diabetes mellitus with other specified complication: Secondary | ICD-10-CM | POA: Insufficient documentation

## 2017-11-08 DIAGNOSIS — Z713 Dietary counseling and surveillance: Secondary | ICD-10-CM | POA: Insufficient documentation

## 2017-11-08 DIAGNOSIS — E7800 Pure hypercholesterolemia, unspecified: Secondary | ICD-10-CM | POA: Insufficient documentation

## 2017-11-08 DIAGNOSIS — E669 Obesity, unspecified: Secondary | ICD-10-CM

## 2017-11-08 NOTE — Progress Notes (Signed)
Sleeve Assessment: 2nd SWL Appointment.   Pts referral states she needs 4 SWL.  Pt is doing fantastic.  Pt arrives having lost about 3 pounds. Pt states since drinking enough water she has not had any cramps. Pt states she has been working on chewing her food well. Pt states her sleep has been pretty good.   Start Wt at [facility]: 218.3 Wt:216 BMI: 35.94  MEDICATIONS: See List   DIETARY INTAKE:  24-hr recall:  B ( AM): wheat toast with peanut butter and banana Snk ( AM): protein bar and fruit or yogurt and fruit L ( PM): tuna sandwich and apple Snk ( PM):grapes D ( PM): grilled chicken salad Snk ( PM): sometimes apple Beverages: water with crystal light   Usual physical activity: 10000 steps and 3 days a week aerobic video   -Follow diet recommendations listed below   Energy and Macronutrient Recomendations: Calories: 1500 Carbohydrate: 170 Protein: 112 Fat: 42   Nutritional Diagnosis:  [place]-3.3 Overweight/obesity related to past poor dietary habits and physical inactivity as evidenced by patient w/ planned sleeve surgery following dietary guidelines for continued weight loss.    Intervention:  Nutrition counseling for upcoming Bariatric Surgery. Goals: -Encouraged to engage in 150 minutes of moderate physical activity including cardiovascular and weight baring weekly -Add drinking to your list and a phone reminder: aiming for 64 ounces  -Keep up the great activity!!  -Eat half your protein bar  Teaching Method Utilized:  Visual Auditory Hands on  Barriers to learning/adherence to lifestyle change: none identified   Demonstrated degree of understanding via:  Teach Back   Monitoring/Evaluation:  Dietary intake, exercise, and body weight prn.

## 2017-12-05 DIAGNOSIS — F509 Eating disorder, unspecified: Secondary | ICD-10-CM | POA: Diagnosis not present

## 2017-12-10 ENCOUNTER — Encounter: Payer: Self-pay | Admitting: Skilled Nursing Facility1

## 2017-12-10 ENCOUNTER — Encounter: Payer: Federal, State, Local not specified - PPO | Attending: General Surgery | Admitting: Skilled Nursing Facility1

## 2017-12-10 DIAGNOSIS — I10 Essential (primary) hypertension: Secondary | ICD-10-CM | POA: Diagnosis not present

## 2017-12-10 DIAGNOSIS — Z713 Dietary counseling and surveillance: Secondary | ICD-10-CM | POA: Insufficient documentation

## 2017-12-10 DIAGNOSIS — E669 Obesity, unspecified: Secondary | ICD-10-CM

## 2017-12-10 DIAGNOSIS — M545 Low back pain: Secondary | ICD-10-CM | POA: Diagnosis not present

## 2017-12-10 DIAGNOSIS — E7800 Pure hypercholesterolemia, unspecified: Secondary | ICD-10-CM | POA: Diagnosis not present

## 2017-12-10 DIAGNOSIS — E1169 Type 2 diabetes mellitus with other specified complication: Secondary | ICD-10-CM | POA: Diagnosis not present

## 2017-12-10 DIAGNOSIS — Z6836 Body mass index (BMI) 36.0-36.9, adult: Secondary | ICD-10-CM | POA: Diagnosis not present

## 2017-12-10 NOTE — Progress Notes (Signed)
Sleeve Assessment: 3rd SWL Appointment.   Pts referral states she needs 4 SWL.  Pt is doing fantastic.   Pt arrives having lost about 2 pounds. Pt states she has been feeling fantastic a with no cravings and decreasing appetite and can leave food on her plate. Pt states she is enjoying the slow weight loss process. Pt states she enjoys being able to eat certain foods she previously thought bad which has been very freeing.  Pt states she has been so successful do to the support she has at home and having family and friends doing it along with her.   Start Wt at [facility]: 218.3 Wt:214 BMI: 35.61  MEDICATIONS: See List   DIETARY INTAKE:  24-hr recall:  B ( AM): 2 boiled eggs and cup of yogurt or fruit Snk ( AM): protein bar and fruit or yogurt and fruit L ( PM): tuna sandwich and apple or vegetables Snk ( PM):grapes D ( PM): grilled chicken salad Snk ( PM):  Beverages: water with crystal light  64 ounces +   Usual physical activity: 10000 steps and 3 days a week aerobic video   -Follow diet recommendations listed below   Energy and Macronutrient Recomendations: Calories: 1500 Carbohydrate: 170 Protein: 112 Fat: 42   Nutritional Diagnosis:  [place]-3.3 Overweight/obesity related to past poor dietary habits and physical inactivity as evidenced by patient w/ planned sleeve surgery following dietary guidelines for continued weight loss.    Intervention:  Nutrition counseling for upcoming Bariatric Surgery. Goals: -Encouraged to engage in 150 minutes of moderate physical activity including cardiovascular and weight baring weekly -Keep up the great activity!!  -Eat half your protein bar  Teaching Method Utilized:  Visual Auditory Hands on  Barriers to learning/adherence to lifestyle change: none identified   Demonstrated degree of understanding via:  Teach Back   Monitoring/Evaluation:  Dietary intake, exercise, and body weight prn.

## 2018-01-07 ENCOUNTER — Encounter: Payer: Federal, State, Local not specified - PPO | Attending: General Surgery | Admitting: Skilled Nursing Facility1

## 2018-01-07 ENCOUNTER — Encounter: Payer: Self-pay | Admitting: Skilled Nursing Facility1

## 2018-01-07 DIAGNOSIS — E669 Obesity, unspecified: Secondary | ICD-10-CM

## 2018-01-07 DIAGNOSIS — E7800 Pure hypercholesterolemia, unspecified: Secondary | ICD-10-CM | POA: Diagnosis not present

## 2018-01-07 DIAGNOSIS — Z713 Dietary counseling and surveillance: Secondary | ICD-10-CM | POA: Diagnosis not present

## 2018-01-07 DIAGNOSIS — I10 Essential (primary) hypertension: Secondary | ICD-10-CM | POA: Insufficient documentation

## 2018-01-07 DIAGNOSIS — E1169 Type 2 diabetes mellitus with other specified complication: Secondary | ICD-10-CM | POA: Diagnosis not present

## 2018-01-07 DIAGNOSIS — Z6836 Body mass index (BMI) 36.0-36.9, adult: Secondary | ICD-10-CM | POA: Diagnosis not present

## 2018-01-07 DIAGNOSIS — M545 Low back pain: Secondary | ICD-10-CM | POA: Insufficient documentation

## 2018-01-07 NOTE — Progress Notes (Signed)
Sleeve Assessment: 4th SWL Appointment.   Pts referral states she needs 4 SWL.  Pt is doing fantastic with her lifestyle changes but feels she needs the restirction of the surgery.   Pt states she has been exercising more due to peak time at the post [employer]. Pt states she has been working 5:00am to 6:30 pm so feeling more tired. Pt states she does want to have surgery.  Pt states she does not forsee herself having any struggles after surgery.   Start Wt at [facility]: 218.3 Wt:213 BMI: 35.46  MEDICATIONS: See List   DIETARY INTAKE:  24-hr recall:  B ( AM): 2 boiled eggs and cup of yogurt or fruit Snk ( AM): protein bar and fruit or yogurt and fruit L ( PM): tuna sandwich and apple or vegetables Snk ( PM):grapes D ( PM): grilled chicken salad Snk ( PM):  Beverages: water with crystal light  64 ounces +   Usual physical activity: 10000 steps and 3 days a week aerobic video   -Follow diet recommendations listed below   Energy and Macronutrient Recomendations: Calories: 1500 Carbohydrate: 170 Protein: 112 Fat: 42   Nutritional Diagnosis:  [place]-3.3 Overweight/obesity related to past poor dietary habits and physical inactivity as evidenced by patient w/ planned sleeve surgery following dietary guidelines for continued weight loss.    Intervention:  Nutrition counseling for upcoming Bariatric Surgery. Goals: -Encouraged to engage in 150 minutes of moderate physical activity including cardiovascular and weight baring weekly -Keep up the great activity!!   Teaching Method Utilized:  Visual Auditory Hands on  Barriers to learning/adherence to lifestyle change: none identified   Demonstrated degree of understanding via:  Teach Back   Monitoring/Evaluation:  Dietary intake, exercise, and body weight prn.

## 2018-01-14 ENCOUNTER — Other Ambulatory Visit (HOSPITAL_COMMUNITY): Payer: Self-pay | Admitting: General Surgery

## 2018-01-14 ENCOUNTER — Other Ambulatory Visit: Payer: Self-pay | Admitting: General Surgery

## 2018-02-04 ENCOUNTER — Encounter: Payer: Self-pay | Admitting: Skilled Nursing Facility1

## 2018-02-04 ENCOUNTER — Encounter: Payer: Federal, State, Local not specified - PPO | Attending: General Surgery | Admitting: Skilled Nursing Facility1

## 2018-02-04 DIAGNOSIS — E1169 Type 2 diabetes mellitus with other specified complication: Secondary | ICD-10-CM | POA: Insufficient documentation

## 2018-02-04 DIAGNOSIS — I10 Essential (primary) hypertension: Secondary | ICD-10-CM | POA: Insufficient documentation

## 2018-02-04 DIAGNOSIS — M545 Low back pain: Secondary | ICD-10-CM | POA: Diagnosis not present

## 2018-02-04 DIAGNOSIS — E669 Obesity, unspecified: Secondary | ICD-10-CM

## 2018-02-04 DIAGNOSIS — Z713 Dietary counseling and surveillance: Secondary | ICD-10-CM | POA: Insufficient documentation

## 2018-02-04 DIAGNOSIS — Z6836 Body mass index (BMI) 36.0-36.9, adult: Secondary | ICD-10-CM | POA: Insufficient documentation

## 2018-02-04 DIAGNOSIS — E7800 Pure hypercholesterolemia, unspecified: Secondary | ICD-10-CM | POA: Diagnosis not present

## 2018-02-04 NOTE — Progress Notes (Signed)
Pre-Operative Nutrition Class:  Appt start time: 4045   End time:  1830.  Patient was seen on 02/04/2018 for Pre-Operative Bariatric Surgery Education at [facility].   Surgery date:  Surgery type: sleeve [facility] weight at [facility]: 218.3 Weight today: 213.4  Samples given per MNT protocol. Patient educated on appropriate usage: Bariatric Advantage Multivitamin Lot # [id number] Exp: 4/21  Bariatric Advantage Calcium  Lot # [id number] Exp: 08/17/2018  Renee Pain Protein Shake Lot # [id number] Exp: march, 302020  The following the learning objectives were met by the patient during this course:  Identify Pre-Op Dietary Goals and will begin 2 weeks pre-operatively  Identify appropriate sources of fluids and proteins   State protein recommendations and appropriate sources pre and post-operatively  Identify Post-Operative Dietary Goals and will follow for 2 weeks post-operatively  Identify appropriate multivitamin and calcium sources  Describe the need for physical activity post-operatively and will follow MD recommendations  State when to call healthcare provider regarding medication questions or post-operative complications  Handouts given during class include:  Pre-Op Bariatric Surgery Diet Handout  Protein Shake Handout  Post-Op Bariatric Surgery Nutrition Handout  [facility] Program Information Flyer  Support Group Information Flyer  [facility] Bariatric Supplements Price List  Follow-Up Plan: Patient will follow-up at [facility] 2 weeks post operatively for diet advancement per MD.

## 2018-02-07 ENCOUNTER — Other Ambulatory Visit (HOSPITAL_COMMUNITY): Payer: Self-pay | Admitting: General Surgery

## 2018-02-07 ENCOUNTER — Ambulatory Visit (HOSPITAL_COMMUNITY)
Admission: RE | Admit: 2018-02-07 | Discharge: 2018-02-07 | Disposition: A | Discharge status: Home / Self Care | Payer: Federal, State, Local not specified - PPO | Source: Ambulatory Visit | Attending: General Surgery | Admitting: General Surgery

## 2018-02-07 DIAGNOSIS — Z01818 Encounter for other preprocedural examination: Secondary | ICD-10-CM | POA: Diagnosis not present

## 2018-02-14 DIAGNOSIS — R232 Flushing: Secondary | ICD-10-CM | POA: Diagnosis not present

## 2018-02-14 DIAGNOSIS — Z0000 Encounter for general adult medical examination without abnormal findings: Secondary | ICD-10-CM | POA: Diagnosis not present

## 2018-02-14 DIAGNOSIS — I10 Essential (primary) hypertension: Secondary | ICD-10-CM | POA: Diagnosis not present

## 2018-02-14 DIAGNOSIS — E119 Type 2 diabetes mellitus without complications: Secondary | ICD-10-CM | POA: Diagnosis not present

## 2018-02-14 DIAGNOSIS — E782 Mixed hyperlipidemia: Secondary | ICD-10-CM | POA: Diagnosis not present

## 2018-06-11 DIAGNOSIS — Z1231 Encounter for screening mammogram for malignant neoplasm of breast: Secondary | ICD-10-CM | POA: Diagnosis not present

## 2018-08-19 DIAGNOSIS — E119 Type 2 diabetes mellitus without complications: Secondary | ICD-10-CM | POA: Diagnosis not present

## 2018-08-19 DIAGNOSIS — E782 Mixed hyperlipidemia: Secondary | ICD-10-CM | POA: Diagnosis not present

## 2018-08-19 DIAGNOSIS — I10 Essential (primary) hypertension: Secondary | ICD-10-CM | POA: Diagnosis not present

## 2018-08-20 DIAGNOSIS — Z1211 Encounter for screening for malignant neoplasm of colon: Secondary | ICD-10-CM | POA: Diagnosis not present

## 2018-09-12 DIAGNOSIS — R42 Dizziness and giddiness: Secondary | ICD-10-CM | POA: Diagnosis not present

## 2018-09-12 DIAGNOSIS — Z20828 Contact with and (suspected) exposure to other viral communicable diseases: Secondary | ICD-10-CM | POA: Diagnosis not present

## 2018-09-16 ENCOUNTER — Other Ambulatory Visit: Payer: Self-pay

## 2018-09-16 DIAGNOSIS — Z20822 Contact with and (suspected) exposure to covid-19: Secondary | ICD-10-CM

## 2018-09-16 DIAGNOSIS — R6889 Other general symptoms and signs: Secondary | ICD-10-CM | POA: Diagnosis not present

## 2018-09-17 ENCOUNTER — Emergency Department (HOSPITAL_COMMUNITY)
Admission: EM | Admit: 2018-09-17 | Discharge: 2018-09-18 | Disposition: A | Discharge status: Home / Self Care | Payer: Federal, State, Local not specified - PPO | Attending: Emergency Medicine | Admitting: Emergency Medicine

## 2018-09-17 ENCOUNTER — Encounter (HOSPITAL_COMMUNITY): Payer: Self-pay | Admitting: Emergency Medicine

## 2018-09-17 ENCOUNTER — Other Ambulatory Visit: Payer: Self-pay

## 2018-09-17 DIAGNOSIS — Z79899 Other long term (current) drug therapy: Secondary | ICD-10-CM | POA: Insufficient documentation

## 2018-09-17 DIAGNOSIS — I10 Essential (primary) hypertension: Secondary | ICD-10-CM | POA: Insufficient documentation

## 2018-09-17 DIAGNOSIS — H81399 Other peripheral vertigo, unspecified ear: Secondary | ICD-10-CM | POA: Insufficient documentation

## 2018-09-17 DIAGNOSIS — R42 Dizziness and giddiness: Secondary | ICD-10-CM | POA: Diagnosis not present

## 2018-09-17 DIAGNOSIS — R51 Headache: Secondary | ICD-10-CM | POA: Diagnosis not present

## 2018-09-17 DIAGNOSIS — H81392 Other peripheral vertigo, left ear: Secondary | ICD-10-CM | POA: Diagnosis not present

## 2018-09-17 LAB — BASIC METABOLIC PANEL
Anion gap: 13 (ref 5–15)
BUN: 10 mg/dL (ref 6–20)
CO2: 23 mmol/L (ref 22–32)
Calcium: 9.3 mg/dL (ref 8.9–10.3)
Chloride: 99 mmol/L (ref 98–111)
Creatinine, Ser: 0.92 mg/dL (ref 0.44–1.00)
GFR calc Af Amer: >60 mL/min (ref >60)
GFR calc non Af Amer: >60 mL/min (ref >60)
Glucose, Bld: 118 mg/dL — ABNORMAL HIGH (ref 70–99)
Potassium: 3.2 mmol/L — ABNORMAL LOW (ref 3.5–5.1)
Sodium: 135 mmol/L (ref 135–145)

## 2018-09-17 LAB — URINALYSIS, ROUTINE W REFLEX MICROSCOPIC
Bilirubin Urine: NEGATIVE
Glucose, UA: NEGATIVE mg/dL
Hgb urine dipstick: NEGATIVE
Ketones, ur: NEGATIVE mg/dL
Leukocytes,Ua: NEGATIVE
Nitrite: NEGATIVE
Protein, ur: NEGATIVE mg/dL
Specific Gravity, Urine: 1.023 (ref 1.005–1.030)
pH: 6 (ref 5.0–8.0)

## 2018-09-17 LAB — CBC
HCT: 36.5 % (ref 36.0–46.0)
Hemoglobin: 11.8 g/dL — ABNORMAL LOW (ref 12.0–15.0)
MCH: 27.6 pg (ref 26.0–34.0)
MCHC: 32.3 g/dL (ref 30.0–36.0)
MCV: 85.3 fL (ref 80.0–100.0)
Platelets: 469 10*3/uL — ABNORMAL HIGH (ref 150–400)
RBC: 4.28 MIL/uL (ref 3.87–5.11)
RDW: 14.6 % (ref 11.5–15.5)
WBC: 13.1 10*3/uL — ABNORMAL HIGH (ref 4.0–10.5)
nRBC: 0 % (ref 0.0–0.2)

## 2018-09-17 LAB — I-STAT BETA HCG BLOOD, ED (MC, WL, AP ONLY): I-stat hCG, quantitative: <5 m[IU]/mL (ref <5)

## 2018-09-17 LAB — NOVEL CORONAVIRUS, NAA: SARS-CoV-2, NAA: NOT DETECTED

## 2018-09-17 MED ORDER — MECLIZINE HCL 25 MG PO TABS
25.0000 mg | ORAL_TABLET | Freq: Once | ORAL | Status: AC
  Administered 2018-09-17: 25 mg via ORAL
  Filled 2018-09-17: qty 1

## 2018-09-17 MED ORDER — SODIUM CHLORIDE 0.9 % IV BOLUS
1000.0000 mL | Freq: Once | INTRAVENOUS | Status: AC
  Administered 2018-09-18: 1000 mL via INTRAVENOUS

## 2018-09-17 MED ORDER — SODIUM CHLORIDE 0.9% FLUSH: 3.0000 mL | Freq: Once | INTRAVENOUS | Status: DC

## 2018-09-17 NOTE — ED Provider Notes (Signed)
[facility] EMERGENCY DEPARTMENT Provider Note   CSN: [id number] Arrival date & time: 09/17/18  1627     History   Chief Complaint Chief Complaint  Patient presents with  . Dizziness    HPI [name] is a 53 y.o. female.     Patient is a 53 year old female with PMH of HTN, DM, Hyperlipidemia presenting with complaints of dizziness.  This is been occurring intermittently for the last 5 days.  She describes a spinning sensation with associated nausea, but no vomiting.  This is worse when she changes position and moves.  She describes a pain in her left ear but no hearing loss or ringing in her ears.  She denies fevers or chills.  The history is provided by the patient.  Dizziness Quality:  Room spinning Severity:  Moderate Onset quality:  Sudden Duration:  4 days Timing:  Intermittent Progression:  Worsening Chronicity:  New Relieved by:  Nothing Worsened by:  Nothing Ineffective treatments:  None tried Associated symptoms: nausea     Past Medical History:  Diagnosis Date  . Complication of anesthesia 1997   facial swelling postop due to anesthesia per pt  . High cholesterol   . Hypertension     Patient Active Problem List   Diagnosis Date Noted  . 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 BREAST SURGERY    . CHOLECYSTECTOMY    . CYST EXCISION     from scalp  . DILATION AND CURETTAGE OF UTERUS    . hysterscopy  2014     OB History   No obstetric history on file.      Home Medications    Prior to Admission medications   Medication Sig Start Date End Date Taking? Authorizing Provider  fluconazole (DIFLUCAN) 150 MG tablet Take 1 tablet (150 mg total) by mouth once. 05/20/12   Lahoma Crocker, MD  ibuprofen (ADVIL,MOTRIN) 800 MG tablet Take 1 tablet (800 mg total) by mouth every 8 (eight) hours as needed for moderate pain. 02/08/14   Domenic Moras, PA-C  Olmesartan-Amlodipine-HCTZ  (TRIBENZOR) 40-5-12.5 MG TABS Take 1 tablet by mouth daily.    [provider]  oxyCODONE-acetaminophen (PERCOCET) 5-325 MG per tablet Take 2 tablets by mouth every 6 (six) hours as needed for pain. 04/05/12   Lahoma Crocker, MD  simvastatin (ZOCOR) 40 MG tablet Take 40 mg by mouth every evening.    [provider]    Family History Family History  Problem Relation Age of Onset  . Diabetes Mother   . Hypertension Mother   . Diabetes Father   . Hypertension Father     Social History Social History   Tobacco Use  . Smoking status: Never Smoker  . Smokeless tobacco: Never Used  Substance Use Topics  . Alcohol use: Yes    Comment: occasionally  . Drug use: No     Allergies   Patient has no known allergies.   Review of Systems Review of Systems  Gastrointestinal: Positive for nausea.  Neurological: Positive for dizziness.  All other systems reviewed and are negative.    Physical Exam Updated Vital Signs BP (!) 170/82 (BP Location: Right Arm)   Pulse 68   Temp 98.4 F (36.9 C) (Oral)   Resp 16   SpO2 100%   Physical Exam Vitals signs and nursing note reviewed.  Constitutional:      General: She is not in acute  distress.    Appearance: She is well-developed. She is not diaphoretic.  HENT:     Head: Normocephalic and atraumatic.     Right Ear: Tympanic membrane normal.     Left Ear: Tympanic membrane normal.     Mouth/Throat:     Mouth: Mucous membranes are moist.  Neck:     Musculoskeletal: Normal range of motion and neck supple.  Cardiovascular:     Rate and Rhythm: Normal rate and regular rhythm.     Heart sounds: No murmur. No friction rub. No gallop.   Pulmonary:     Effort: Pulmonary effort is normal. No respiratory distress.     Breath sounds: Normal breath sounds. No wheezing.  Abdominal:     General: Bowel sounds are normal. There is no distension.     Palpations: Abdomen is soft.     Tenderness: There is no abdominal  tenderness.  Musculoskeletal: Normal range of motion.  Skin:    General: Skin is warm and dry.  Neurological:     General: No focal deficit present.     Mental Status: She is alert and oriented to person, place, and time.     Cranial Nerves: No cranial nerve deficit.     Sensory: No sensory deficit.     Motor: No weakness.     Coordination: Coordination normal.      ED Treatments / Results  Labs (all labs ordered are listed, but only abnormal results are displayed) Labs Reviewed  BASIC METABOLIC PANEL - Abnormal; Notable for the following components:      Result Value   Potassium 3.2 (*)    Glucose, Bld 118 (*)    All other components within normal limits  CBC - Abnormal; Notable for the following components:   WBC 13.1 (*)    Hemoglobin 11.8 (*)    Platelets 469 (*)    All other components within normal limits  URINALYSIS, ROUTINE W REFLEX MICROSCOPIC  CBG MONITORING, ED  I-STAT BETA HCG BLOOD, ED ([facility], [facility], [facility] ONLY)    EKG None  Radiology No results found.  Procedures Procedures (including critical care time)  Medications Ordered in ED Medications  sodium chloride flush (NS) 0.9 % injection 3 mL (3 mLs Intravenous Not Given 09/17/18 2350)  sodium chloride 0.9 % bolus 1,000 mL (has no administration in time range)  meclizine (ANTIVERT) tablet 25 mg (has no administration in time range)     Initial Impression / Assessment and Plan / ED Course  I have reviewed the triage vital signs and the nursing notes.  Pertinent labs & imaging results that were available during my care of the patient were reviewed by me and considered in my medical decision making (see chart for details).  Patient presents here with complaints of dizziness and left ear discomfort.  Patient's symptoms are most consistent with a peripheral vertigo.  The dizziness is worse with change in position and movement and relieved with rest.  Her neurologic exam is nonfocal and head CT is negative.   Patient feeling better after IV fluids and meclizine.  Laboratory studies are unremarkable.  Patient will be discharged with meclizine and PRN return.  Final Clinical Impressions(s) / ED Diagnoses   Final diagnoses:  None    ED Discharge Orders    None       Veryl Speak, MD 09/18/18 [phone]

## 2018-09-17 NOTE — ED Triage Notes (Signed)
Pt with dizziness r/t vertigo, she reports being dx by pcp but not given any medications. Pain in the left ear along with headache and feels the room is spinning. NAD at triage.

## 2018-09-18 ENCOUNTER — Emergency Department (HOSPITAL_COMMUNITY): Payer: Federal, State, Local not specified - PPO

## 2018-09-18 ENCOUNTER — Other Ambulatory Visit: Payer: Self-pay

## 2018-09-18 DIAGNOSIS — R51 Headache: Secondary | ICD-10-CM | POA: Diagnosis not present

## 2018-09-18 MED ORDER — MECLIZINE HCL 25 MG PO TABS: 25.0000 mg | ORAL_TABLET | Freq: Three times a day (TID) | ORAL | 0 refills | Status: DC | PRN

## 2018-09-18 NOTE — Discharge Instructions (Addendum)
Meclizine as prescribed as needed for dizziness.  Follow-up with your primary doctor if symptoms are not resolving in the next few days, and return to the ER if symptoms significantly worsen or change.

## 2018-09-18 NOTE — ED Notes (Signed)
Patient transported to CT 

## 2018-10-10 ENCOUNTER — Ambulatory Visit: Payer: Self-pay | Admitting: General Surgery

## 2018-10-10 DIAGNOSIS — E7800 Pure hypercholesterolemia, unspecified: Secondary | ICD-10-CM | POA: Diagnosis not present

## 2018-10-10 DIAGNOSIS — M545 Low back pain: Secondary | ICD-10-CM | POA: Diagnosis not present

## 2018-10-10 DIAGNOSIS — I10 Essential (primary) hypertension: Secondary | ICD-10-CM | POA: Diagnosis not present

## 2018-10-10 NOTE — H&P (Signed)
Glendale Chard Documented: 10/10/2018 10:15 AM Location: [facility] Patient #: [id number] DOB: 09/10/1965 Single / Language: [name] / Race: Black or African American Female  History of Present Illness Randall Hiss M. Marvie Brevik MD; 10/10/2018 10:53 AM) The patient is a 53 year old female who presents for a bariatric surgery evaluation. She comes in for long-term follow-up for her weight as well as her comorbidities. I initially met her in July 2019. Her weight at that time was 216 pounds with a BMI of 36. She denies any medical changes other than a brief stint or episodes of dizziness back in late August. It prompted a trip to the emergency room. She also had some left ear pain at that time. She had a myxomatosis of around 13,000 otherwise her labs were normal except for some mild hypokalemia. Her CT head was negative. She is no longer having those symptoms. She had an upper GI and a chest x-ray as part of our bariatric surgery screening. They were unremarkable. Bariatric evaluation labs were also unremarkable. Her most recent hemoglobin A1c was 6.5 which is somewhat improved. She had normal comprehensive metabolic panel. We reviewed her other labs as well. She denies any chest pain, chest pressure, source of breath, orthopnea, paroxysmal nocturnal dyspnea, melena or hematochezia. She denies any dysuria. She denies any heartburn or reflux.  JULY 2019 She is referred by Dr Justin Mend for evaluation of weight loss surgery. She completed our seminar in person. She is debating between the sleeve gastrectomy and the laparoscopic adjustable gastric band. Her goals with weight loss surgery are to decrease the number medication she is on an improve her physical activity. Despite numerous attempts for sustained weight loss she's been unsuccessful. She has tried Weight Watchers on several occasions, Nutrisystem on several occasions, physician prescribed medication such as Phentermine and Meridia, as  well as Slim fast and low-calorie diets-all without any long-term success.  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 She had a breast reduction a few months ago. She denies any reflux or indigestion or nausea or vomiting. Occasionally she may feel like a solid food gets stuck. She denies any diarrhea or constipation or melena or hematochezia. She has had a laparoscopic cholecystectomy. She denies any dysuria or hematuria. She states that she is a bulging disc in her lower back which causes central back pain with left lower leg sciatica. She has had diabetes for 3-4 years. She is not on insulin. She denies any migraines. She denies any tobacco or alcohol use.  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 She has 2 children and has a grandchild on the way.  She states that she had recent blood work at her PCPs office   Problem List/Past Medical Randall Hiss M. Redmond Pulling, MD; 10/10/2018 10:55 AM) Virgina Evener OBESITY (E66.01) I think the patient is a candidate for weight loss surgery. She has attempted medical weight loss in the past without any long-term success.  Past Surgical History Randall Hiss M. Redmond Pulling, MD; 10/10/2018 10:55 AM) Gallbladder Surgery - Laparoscopic Mammoplasty; Reduction Bilateral. Oral Surgery  Diagnostic Studies History Randall Hiss M. Redmond Pulling, MD; 10/10/2018 10:55 AM) Colonoscopy never Mammogram within last year Pap Smear 1-5 years ago  Allergies (Tanisha A. Owens Shark, [place]; 10/10/2018 10:15 AM) No Known Drug Allergies [08/15/2017]: Allergies Reconciled  Medication  History (Tanisha A. Owens Shark, RMA; 10/10/2018 10:15 AM) AmLODIPine Besylate ('5MG'$  Tablet, Oral) Active. Simvastatin ('40MG'$  Tablet, Oral)  Active. Valsartan ('160MG'$  Tablet, Oral) Active. MetFORMIN HCl ('1000MG'$  Tablet, Oral) Active. HydroCHLOROthiazide (12.'5MG'$  Tablet, Oral) Active. FLUoxetine HCl ('20MG'$  Capsule, Oral) Active. Medications Reconciled  Social History Randall Hiss M. Redmond Pulling, MD; 10/10/2018 10:55 AM) Alcohol use Remotely quit alcohol use. Caffeine use Coffee, Tea. No drug use Tobacco use Never smoker.  Family History Randall Hiss M. Redmond Pulling, MD; 10/10/2018 10:55 AM) Arthritis Father. Diabetes Mellitus Father, Mother, Sister. Hypertension Brother, Family Members In [place], Father, Mother, Sister.  Pregnancy / Birth History Randall Hiss M. Redmond Pulling, MD; 10/10/2018 10:55 AM) Age at menarche 9 years. Contraceptive History Contraceptive implant. Gravida 3 Irregular periods Length (months) of breastfeeding 3-6 Maternal age 44-25 Para 2  Other Problems Randall Hiss M. Redmond Pulling, MD; 10/10/2018 10:55 AM) HYPERTENSION, ESSENTIAL (I10) HYPERCHOLESTEROLEMIA (E78.00) BACK PAIN, LUMBOSACRAL (M54.5) DIABETES MELLITUS TYPE 2 IN OBESE (E11.69)     Review of Systems Randall Hiss M. Glynda Soliday MD; 10/10/2018 10:53 AM) General Not Present- Appetite Loss, Chills, Fatigue, Fever, Night Sweats, Weight Gain and Weight Loss. Skin Not Present- Change in Wart/Mole, Dryness, Hives, Jaundice, New Lesions, Non-Healing Wounds, Rash and Ulcer. HEENT Not Present- Earache, Hearing Loss, Hoarseness, Nose Bleed, Oral Ulcers, Ringing in the Ears, Seasonal Allergies, Sinus Pain, Sore Throat, Visual Disturbances, Wears glasses/contact lenses and Yellow Eyes. Respiratory Not Present- Bloody sputum, Chronic Cough, Difficulty Breathing, Snoring and Wheezing. Breast Not Present- Breast Mass, Breast Pain, Nipple Discharge and Skin Changes. Cardiovascular Not Present- Chest Pain, Difficulty Breathing Lying Down, Leg Cramps, Palpitations, Rapid Heart Rate, Shortness of Breath and Swelling of Extremities. Gastrointestinal Not Present- Abdominal Pain, Bloating, Bloody  Stool, Change in Bowel Habits, Chronic diarrhea, Constipation, Difficulty Swallowing, Excessive gas, Gets full quickly at meals, Hemorrhoids, Indigestion, Nausea, Rectal Pain and Vomiting. Female Genitourinary Not Present- Frequency, Nocturia, Painful Urination, Pelvic Pain and Urgency. Musculoskeletal Present- Back Pain. Not Present- Joint Pain, Joint Stiffness, Muscle Pain, Muscle Weakness and Swelling of Extremities. Neurological Not Present- Decreased Memory, Fainting, Headaches, Numbness, Seizures, Tingling, Tremor, Trouble walking and Weakness. Psychiatric Not Present- Anxiety, Bipolar, Change in Sleep Pattern, Depression, Fearful and Frequent crying. Endocrine Present- Hot flashes. Not Present- Cold Intolerance, Excessive Hunger, Hair Changes, Heat Intolerance and New Diabetes. Hematology Not Present- Blood Thinners, Easy Bruising, Excessive bleeding, Gland problems, HIV and Persistent Infections.  Vitals (Tanisha A. Brown RMA; 10/10/2018 10:15 AM) 10/10/2018 10:15 AM Weight: 217.2 lb Height: 65in Body Surface Area: 2.05 m Body Mass Index: 36.14 kg/m  Temp.: 49F  Pulse: 102 (Regular)  BP: 132/74 (Sitting, Left Arm, Standard)        Physical Exam Randall Hiss M. Tambra Muller MD; 10/10/2018 10:53 AM)  General Mental Status-Alert. General Appearance-Consistent with stated age. Hydration-Well hydrated. Voice-Normal. Note: class ii obesity, truncal  Head and Neck Head-normocephalic, atraumatic with no lesions or palpable masses. Trachea-midline. Thyroid Gland Characteristics - normal size and consistency.  Eye Eyeball - Bilateral-Extraocular movements intact. Sclera/Conjunctiva - Bilateral-No scleral icterus.  ENMT Ears -Note:normal external ears.  Mouth and Throat -Note:lips intact.   Chest and Lung Exam Chest and lung exam reveals -quiet, even and easy respiratory effort with no use of accessory muscles and on auscultation, normal breath  sounds, no adventitious sounds and normal vocal resonance. Inspection Chest Wall - Normal. Back - normal.  Breast - Did not examine.  Cardiovascular Cardiovascular examination reveals -normal heart sounds, regular rate and rhythm with no murmurs and normal pedal pulses bilaterally.  Abdomen Inspection Inspection of the abdomen reveals -  No Hernias. Skin - Scar - no surgical scars. Palpation/Percussion Palpation and Percussion of the abdomen reveal - Soft, Non Tender, No Rebound tenderness, No Rigidity (guarding) and No hepatosplenomegaly. Auscultation Auscultation of the abdomen reveals - Bowel sounds normal.  Peripheral Vascular Upper Extremity Palpation - Pulses bilaterally normal.  Neurologic Neurologic evaluation reveals -alert and oriented x 3 with no impairment of recent or remote memory. Mental Status-Normal.  Neuropsychiatric The patient's mood and affect are described as -normal. Judgment and Insight-insight is appropriate concerning matters relevant to self.  Musculoskeletal Normal Exam - Left-Upper Extremity Strength Normal and Lower Extremity Strength Normal. Normal Exam - Right-Upper Extremity Strength Normal and Lower Extremity Strength Normal.  Lymphatic Head & Neck  General Head & Neck Lymphatics: Bilateral - Description - Normal. Axillary - Did not examine. Femoral & Inguinal - Did not examine.    Assessment & Plan Randall Hiss M. Holly Iannaccone MD; 10/10/2018 10:55 AM)  Virgina Evener OBESITY (E66.01) Story: I think the patient is a candidate for weight loss surgery. She has attempted medical weight loss in the past without any long-term success. Impression: The patient meets weight loss surgery criteria. I think the patient would be an acceptable candidate for Laparoscopic vertical sleeve gastrectomy.  We briefly rediscussed LSG. I offered to rediscussed risk and benefits but she declined. She signed a surgical consent form. We reviewed her evaluation. We  discussed the typical hospital course as well as the typical postoperative course. We discussed the typical quirks that we see after surgery. We discussed the changes in eating behavior after surgery. All of her questions were asked and answered.  Current Plans Pt Education - EMW_preopbariatric  HYPERCHOLESTEROLEMIA (E78.00)   HYPERTENSION, ESSENTIAL (I10)   BACK PAIN, LUMBOSACRAL (M54.5)   DIABETES MELLITUS TYPE 2 IN OBESE (E11.69)  Leighton Ruff. Redmond Pulling, MD, FACS General, Bariatric, & Minimally Invasive Surgery [facility], [place]

## 2018-10-22 DIAGNOSIS — R5382 Chronic fatigue, unspecified: Secondary | ICD-10-CM | POA: Diagnosis not present

## 2018-11-11 ENCOUNTER — Encounter: Payer: Federal, State, Local not specified - PPO | Attending: General Surgery | Admitting: Skilled Nursing Facility1

## 2018-11-11 ENCOUNTER — Other Ambulatory Visit: Payer: Self-pay

## 2018-11-11 DIAGNOSIS — E669 Obesity, unspecified: Secondary | ICD-10-CM | POA: Diagnosis not present

## 2018-11-11 NOTE — Progress Notes (Signed)
Pre-Operative Nutrition Class:  Appt start time: 9324   End time:  1830.  Patient was seen on 11/11/2018 for Pre-Operative Bariatric Surgery Education at [facility].   Surgery date:  Surgery type: sleeve Start weight at [facility]: 218.3 Weight today: 213.2  Samples given per MNT protocol. Patient educated on appropriate usage: Bariatric Advantage Multivitamin Lot # [id number] Exp: 01/22   The following the learning objectives were met by the patient during this course:  Identify Pre-Op Dietary Goals and will begin 2 weeks pre-operatively  Identify appropriate sources of fluids and proteins   State protein recommendations and appropriate sources pre and post-operatively  Identify Post-Operative Dietary Goals and will follow for 2 weeks post-operatively  Identify appropriate multivitamin and calcium sources  Describe the need for physical activity post-operatively and will follow MD recommendations  State when to call healthcare provider regarding medication questions or post-operative complications  Handouts given during class include:  Pre-Op Bariatric Surgery Diet Handout  Protein Shake Handout  Post-Op Bariatric Surgery Nutrition Handout  [facility] Program Information Flyer  Support Group Information Flyer  [facility] Bariatric Supplements Price List  Follow-Up Plan: Patient will follow-up at [facility] : [facility] 2 weeks post operatively for diet advancement per MD.

## 2018-12-10 DIAGNOSIS — E119 Type 2 diabetes mellitus without complications: Secondary | ICD-10-CM | POA: Diagnosis not present

## 2018-12-17 NOTE — Patient Instructions (Addendum)
DUE TO COVID-19 ONLY ONE VISITOR IS ALLOWED TO COME WITH YOU AND STAY IN THE WAITING ROOM ONLY DURING PRE OP AND PROCEDURE DAY OF SURGERY. THE 1 VISITOR MAY VISIT WITH YOU AFTER SURGERY IN YOUR PRIVATE ROOM DURING VISITING HOURS ONLY!  YOU NEED TO HAVE A COVID 19 TEST ON 12-20-2018 @ 940 am, THIS TEST MUST BE DONE BEFORE SURGERY, COME  [address] [place], [place] , [address].  ([place]) ONCE YOUR COVID TEST IS COMPLETED, PLEASE BEGIN THE QUARANTINE INSTRUCTIONS AS OUTLINED IN YOUR HANDOUT.                JAINE KLINGSHIRN    Your procedure is scheduled on: 12-24-2018   Report to [facility] Main  Entrance   Report to short stay at 530  AM     Call this number if you have problems the morning of surgery [phone]    Remember:. BRUSH YOUR TEETH MORNING OF SURGERY AND RINSE YOUR MOUTH OUT, NO CHEWING GUM CANDY OR MINTS.   MORNING OF SURGERY DRINK:   DRINK 1 G2 drink BEFORE YOU LEAVE HOME, DRINK ALL OF THE  G2 DRINK AT ONE TIME.   NO SOLID FOOD AFTER 600 PM THE NIGHT BEFORE YOUR SURGERY. YOU MAY DRINK CLEAR FLUIDS. THE G2 DRINK YOU DRINK BEFORE YOU LEAVE HOME WILL BE THE LAST FLUIDS YOU DRINK BEFORE SURGERY. DRINK G2 DRINK AT 5 AM.  PAIN IS EXPECTED AFTER SURGERY AND WILL NOT BE COMPLETELY ELIMINATED. AMBULATION AND TYLENOL WILL HELP REDUCE INCISIONAL AND GAS PAIN. MOVEMENT IS KEY!  YOU ARE EXPECTED TO BE OUT OF BED WITHIN 4 HOURS OF ADMISSION TO YOUR PATIENT ROOM.  SITTING IN THE RECLINER THROUGHOUT THE DAY IS IMPORTANT FOR DRINKING FLUIDS AND MOVING GAS THROUGHOUT THE GI TRACT.  COMPRESSION STOCKINGS SHOULD BE WORN [place] UNLESS YOU ARE WALKING.   INCENTIVE SPIROMETER SHOULD BE USED EVERY HOUR WHILE AWAKE TO DECREASE POST-OPERATIVE COMPLICATIONS SUCH AS PNEUMONIA.  WHEN DISCHARGED HOME, IT IS IMPORTANT TO CONTINUE TO WALK EVERY HOUR AND USE THE INCENTIVE SPIROMETER EVERY HOUR.     CLEAR LIQUID DIET   Foods Allowed                                                                      Foods Excluded  Coffee and tea, regular and decaf                             liquids that you cannot  Plain Jell-O any favor except red or purple                                           see through such as: Fruit ices (not with fruit pulp)                                     milk, soups, orange juice  Iced Popsicles  All solid food Carbonated beverages, regular and diet                                    Cranberry, grape and apple juices Sports drinks like Gatorade Lightly seasoned clear broth or consume(fat free) Sugar, honey syrup  Sample Menu Breakfast                                Lunch                                     Supper Cranberry juice                    Beef broth                            Chicken broth Jell-O                                     Grape juice                           Apple juice Coffee or tea                        Jell-O                                      Popsicle                                                Coffee or tea                        Coffee or tea  _____________________________________________________________________   Take metformin as usual day before surgery Do not take metformin day of surgery Patient does not check cbg at home        Take these medicines the morning of surgery with A SIP OF WATER: FLUOXETINE (PROZAC), AMLODIPINE (NORVASC)                               You may not have any metal on your body including hair pins and              piercings  Do not wear jewelry, make-up, lotions, powders or perfumes, deodorant             Do not wear nail polish on your fingernails.  Do not shave  48 hours prior to surgery.              Men may shave face and neck.   Do not bring valuables to the hospital. [place].  Contacts, dentures or bridgework may not be worn into  surgery.  Leave  suitcase in the car. After surgery it may be brought to your room.                  Please read over the following fact sheets you were given: _____________________________________________________________________             [facility] - Preparing for Surgery Before surgery, you can play an important role.  Because skin is not sterile, your skin needs to be as free of germs as possible.  You can reduce the number of germs on your skin by washing with CHG (chlorahexidine gluconate) soap before surgery.  CHG is an antiseptic cleaner which kills germs and bonds with the skin to continue killing germs even after washing. Please DO NOT use if you have an allergy to CHG or antibacterial soaps.  If your skin becomes reddened/irritated stop using the CHG and inform your nurse when you arrive at Short Stay. Do not shave (including legs and underarms) for at least 48 hours prior to the first CHG shower.  You may shave your face/neck. Please follow these instructions carefully:  1.  Shower with CHG Soap the night before surgery and the  morning of Surgery.  2.  If you choose to wash your hair, wash your hair first as usual with your  normal  shampoo.  3.  After you shampoo, rinse your hair and body thoroughly to remove the  shampoo.                           4.  Use CHG as you would any other liquid soap.  You can apply chg directly  to the skin and wash                       Gently with a scrungie or clean washcloth.  5.  Apply the CHG Soap to your body ONLY FROM THE NECK DOWN.   Do not use on face/ open                           Wound or open sores. Avoid contact with eyes, ears mouth and genitals (private parts).                       Wash face,  Genitals (private parts) with your normal soap.             6.  Wash thoroughly, paying special attention to the area where your surgery  will be performed.  7.  Thoroughly rinse your body with warm water from the neck down.  8.  DO NOT shower/wash with  your normal soap after using and rinsing off  the CHG Soap.                9.  Pat yourself dry with a clean towel.            10.  Wear clean pajamas.            11.  Place clean sheets on your bed the night of your first shower and do not  sleep with pets. Day of Surgery : Do not apply any lotions/deodorants the morning of surgery.  Please wear clean clothes to the hospital/surgery center.  FAILURE TO FOLLOW THESE INSTRUCTIONS MAY RESULT IN THE CANCELLATION OF YOUR SURGERY PATIENT SIGNATURE_________________________________  NURSE SIGNATURE__________________________________  ________________________________________________________________________  Incentive Spirometer  An incentive spirometer is a tool that can help keep your lungs clear and active. This tool measures how well you are filling your lungs with each breath. Taking long deep breaths may help reverse or decrease the chance of developing breathing (pulmonary) problems (especially infection) following:  A long period of time when you are unable to move or be active. BEFORE THE PROCEDURE   If the spirometer includes an indicator to show your best effort, your nurse or respiratory therapist will set it to a desired goal.  If possible, sit up straight or lean slightly forward. Try not to slouch.  Hold the incentive spirometer in an upright position. INSTRUCTIONS FOR USE  1. Sit on the edge of your bed if possible, or sit up as far as you can in bed or on a chair. 2. Hold the incentive spirometer in an upright position. 3. Breathe out normally. 4. Place the mouthpiece in your mouth and seal your lips tightly around it. 5. Breathe in slowly and as deeply as possible, raising the piston or the ball toward the top of the column. 6. Hold your breath for 3-5 seconds or for as long as possible. Allow the piston or ball to fall to the bottom of the column. 7. Remove the mouthpiece from your mouth and breathe out normally. 8. Rest  for a few seconds and repeat Steps 1 through 7 at least 10 times every 1-2 hours when you are awake. Take your time and take a few normal breaths between deep breaths. 9. The spirometer may include an indicator to show your best effort. Use the indicator as a goal to work toward during each repetition. 10. After each set of 10 deep breaths, practice coughing to be sure your lungs are clear. If you have an incision (the cut made at the time of surgery), support your incision when coughing by placing a pillow or rolled up towels firmly against it. Once you are able to get out of bed, walk around indoors and cough well. You may stop using the incentive spirometer when instructed by your caregiver.  RISKS AND COMPLICATIONS  Take your time so you do not get dizzy or light-headed.  If you are in pain, you may need to take or ask for pain medication before doing incentive spirometry. It is harder to take a deep breath if you are having pain. AFTER USE  Rest and breathe slowly and easily.  It can be helpful to keep track of a log of your progress. Your caregiver can provide you with a simple table to help with this. If you are using the spirometer at home, follow these instructions: [place] IF:   You are having difficultly using the spirometer.  You have trouble using the spirometer as often as instructed.  Your pain medication is not giving enough relief while using the spirometer.  You develop fever of 100.5 F (38.1 C) or higher. SEEK IMMEDIATE MEDICAL CARE IF:   You cough up bloody sputum that had not been present before.  You develop fever of 102 F (38.9 C) or greater.  You develop worsening pain at or near the incision site. MAKE SURE YOU:   Understand these instructions.  Will watch your condition.  Will get help right away if you are not doing well or get worse. Document Released: 05/22/2006 Document Revised: 04/03/2011 Document Reviewed: 07/23/2006 ExitCare  Patient Information 2014 ExitCare, [place].   ________________________________________________________________________  WHAT IS A BLOOD TRANSFUSION? Blood  Transfusion Information  A transfusion is the replacement of blood or some of its parts. Blood is made up of multiple cells which provide different functions.  Red blood cells carry oxygen and are used for blood loss replacement.  White blood cells fight against infection.  Platelets control bleeding.  Plasma helps clot blood.  Other blood products are available for specialized needs, such as hemophilia or other clotting disorders. BEFORE THE TRANSFUSION  Who gives blood for transfusions?   Healthy volunteers who are fully evaluated to make sure their blood is safe. This is blood bank blood. Transfusion therapy is the safest it has ever been in the practice of medicine. Before blood is taken from a donor, a complete history is taken to make sure that person has no history of diseases nor engages in risky social behavior (examples are intravenous drug use or sexual activity with multiple partners). The donor's travel history is screened to minimize risk of transmitting infections, such as malaria. The donated blood is tested for signs of infectious diseases, such as HIV and hepatitis. The blood is then tested to be sure it is compatible with you in order to minimize the chance of a transfusion reaction. If you or a relative donates blood, this is often done in anticipation of surgery and is not appropriate for emergency situations. It takes many days to process the donated blood. RISKS AND COMPLICATIONS Although transfusion therapy is very safe and saves many lives, the main dangers of transfusion include:   Getting an infectious disease.  Developing a transfusion reaction. This is an allergic reaction to something in the blood you were given. Every precaution is taken to prevent this. The decision to have a blood transfusion has been  considered carefully by your caregiver before blood is given. Blood is not given unless the benefits outweigh the risks. AFTER THE TRANSFUSION  Right after receiving a blood transfusion, you will usually feel much better and more energetic. This is especially true if your red blood cells have gotten low (anemic). The transfusion raises the level of the red blood cells which carry oxygen, and this usually causes an energy increase.  The nurse administering the transfusion will monitor you carefully for complications. HOME CARE INSTRUCTIONS  No special instructions are needed after a transfusion. You may find your energy is better. Speak with your caregiver about any limitations on activity for underlying diseases you may have. SEEK MEDICAL CARE IF:   Your condition is not improving after your transfusion.  You develop redness or irritation at the intravenous (IV) site. SEEK IMMEDIATE MEDICAL CARE IF:  Any of the following symptoms occur over the next 12 hours:  Shaking chills.  You have a temperature by mouth above 102 F (38.9 C), not controlled by medicine.  Chest, back, or muscle pain.  People around you feel you are not acting correctly or are confused.  Shortness of breath or difficulty breathing.  Dizziness and fainting.  You get a rash or develop hives.  You have a decrease in urine output.  Your urine turns a dark color or changes to pink, red, or brown. Any of the following symptoms occur over the next 10 days:  You have a temperature by mouth above 102 F (38.9 C), not controlled by medicine.  Shortness of breath.  Weakness after normal activity.  The white part of the eye turns yellow (jaundice).  You have a decrease in the amount of urine or are urinating less often.  Your urine turns  a dark color or changes to pink, red, or brown. Document Released: 01/07/2000 Document Revised: 04/03/2011 Document Reviewed: 08/26/2007 [facility] Patient Information 2014  [place], [place].  _______________________________________________________________________

## 2018-12-18 ENCOUNTER — Other Ambulatory Visit: Payer: Self-pay

## 2018-12-18 ENCOUNTER — Encounter (HOSPITAL_COMMUNITY)
Admission: RE | Admit: 2018-12-18 | Discharge: 2018-12-18 | Disposition: A | Discharge status: Home / Self Care | Payer: Federal, State, Local not specified - PPO | Source: Ambulatory Visit | Attending: General Surgery | Admitting: General Surgery

## 2018-12-18 ENCOUNTER — Other Ambulatory Visit (HOSPITAL_COMMUNITY): Payer: Federal, State, Local not specified - PPO

## 2018-12-18 ENCOUNTER — Encounter (HOSPITAL_COMMUNITY): Payer: Self-pay

## 2018-12-18 DIAGNOSIS — Z01812 Encounter for preprocedural laboratory examination: Secondary | ICD-10-CM | POA: Diagnosis not present

## 2018-12-18 HISTORY — DX: Unspecified thoracic, thoracolumbar and lumbosacral intervertebral disc disorder: M51.9

## 2018-12-18 HISTORY — DX: Type 2 diabetes mellitus without complications: E11.9

## 2018-12-18 LAB — COMPREHENSIVE METABOLIC PANEL
ALT: 17 U/L (ref 0–44)
AST: 18 U/L (ref 15–41)
Albumin: 4.1 g/dL (ref 3.5–5.0)
Alkaline Phosphatase: 74 U/L (ref 38–126)
Anion gap: 7 (ref 5–15)
BUN: 14 mg/dL (ref 6–20)
CO2: 28 mmol/L (ref 22–32)
Calcium: 9.1 mg/dL (ref 8.9–10.3)
Chloride: 104 mmol/L (ref 98–111)
Creatinine, Ser: 0.89 mg/dL (ref 0.44–1.00)
GFR calc Af Amer: >60 mL/min (ref >60)
GFR calc non Af Amer: >60 mL/min (ref >60)
Glucose, Bld: 98 mg/dL (ref 70–99)
Potassium: 3.9 mmol/L (ref 3.5–5.1)
Sodium: 139 mmol/L (ref 135–145)
Total Bilirubin: 0.6 mg/dL (ref 0.3–1.2)
Total Protein: 7.1 g/dL (ref 6.5–8.1)

## 2018-12-18 LAB — ABO/RH: ABO/RH(D): O POS

## 2018-12-18 LAB — GLUCOSE, CAPILLARY: Glucose-Capillary: 95 mg/dL (ref 70–99)

## 2018-12-18 LAB — CBC WITH DIFFERENTIAL/PLATELET
Abs Immature Granulocytes: 0.02 10*3/uL (ref 0.00–0.07)
Basophils Absolute: 0.1 10*3/uL (ref 0.0–0.1)
Basophils Relative: 1 %
Eosinophils Absolute: 0.5 10*3/uL (ref 0.0–0.5)
Eosinophils Relative: 5 %
HCT: 36.5 % (ref 36.0–46.0)
Hemoglobin: 11.6 g/dL — ABNORMAL LOW (ref 12.0–15.0)
Immature Granulocytes: 0 %
Lymphocytes Relative: 26 %
Lymphs Abs: 2.6 10*3/uL (ref 0.7–4.0)
MCH: 27.4 pg (ref 26.0–34.0)
MCHC: 31.8 g/dL (ref 30.0–36.0)
MCV: 86.1 fL (ref 80.0–100.0)
Monocytes Absolute: 0.7 10*3/uL (ref 0.1–1.0)
Monocytes Relative: 7 %
Neutro Abs: 6.2 10*3/uL (ref 1.7–7.7)
Neutrophils Relative %: 61 %
Platelets: 406 10*3/uL — ABNORMAL HIGH (ref 150–400)
RBC: 4.24 MIL/uL (ref 3.87–5.11)
RDW: 15.7 % — ABNORMAL HIGH (ref 11.5–15.5)
WBC: 10 10*3/uL (ref 4.0–10.5)
nRBC: 0 % (ref 0.0–0.2)

## 2018-12-18 LAB — HEMOGLOBIN A1C
Hgb A1c MFr Bld: 6.2 % — ABNORMAL HIGH (ref 4.8–5.6)
Mean Plasma Glucose: 131.24 mg/dL

## 2018-12-18 NOTE — Progress Notes (Signed)
PCP - vieta bland Cardiologist - none Chest x-ray - 02-07-18 epic EKG - 09-17-18 epic Stress Test - none ECHO - none Cardiac Cath -  Sleep study none CPAP - none  Fasting Blood Sugar -does not check cbg at home Checks Blood Sugar _____ times a day  Blood Thinner Instructions:n/a Aspirin Instructions: Last Dose:n/a Anesthesia review:   Patient denies shortness of breath, fever, cough and chest pain at PAT appointment   Patient verbalized understanding of instructions that were given to them at the PAT appointment. Patient was also instructed that they will need to review over the PAT instructions again at home before surgery.

## 2018-12-20 ENCOUNTER — Other Ambulatory Visit (HOSPITAL_COMMUNITY)
Admission: RE | Admit: 2018-12-20 | Discharge: 2018-12-20 | Disposition: A | Discharge status: Home / Self Care | Payer: Federal, State, Local not specified - PPO | Source: Ambulatory Visit | Attending: General Surgery | Admitting: General Surgery

## 2018-12-20 DIAGNOSIS — Z20828 Contact with and (suspected) exposure to other viral communicable diseases: Secondary | ICD-10-CM | POA: Diagnosis not present

## 2018-12-20 DIAGNOSIS — Z01812 Encounter for preprocedural laboratory examination: Secondary | ICD-10-CM | POA: Insufficient documentation

## 2018-12-20 LAB — SARS CORONAVIRUS 2 (TAT 6-24 HRS): SARS Coronavirus 2: NEGATIVE

## 2018-12-23 MED ORDER — BUPIVACAINE LIPOSOME 1.3 % IJ SUSP
20.0000 mL | Freq: Once | INTRAMUSCULAR | Status: DC
  Filled 2018-12-23: qty 20

## 2018-12-23 NOTE — Anesthesia Preprocedure Evaluation (Addendum)
Anesthesia Evaluation  Patient identified by MRN, date of birth, ID band Patient awake    Reviewed: Allergy & Precautions, NPO status , Patient's Chart, lab work & pertinent test results  History of Anesthesia Complications Negative for: history of anesthetic complications  Airway Mallampati: II  TM Distance: >3 FB Neck ROM: Full    Dental  (+) Dental Advisory Given, Teeth Intact   Pulmonary neg pulmonary ROS,  12/20/2018 SARS CoV2 NEG   breath sounds clear to auscultation       Cardiovascular hypertension, Pt. on medications (-) angina Rhythm:Regular Rate:Normal     Neuro/Psych negative neurological ROS     GI/Hepatic negative GI ROS, Neg liver ROS,   Endo/Other  diabetes (glu 103), Oral Hypoglycemic AgentsMorbid obesity  Renal/GU negative Renal ROS     Musculoskeletal   Abdominal (+) + obese,   Peds  Hematology   Anesthesia Other Findings   Reproductive/Obstetrics mirena                            Anesthesia Physical Anesthesia Plan  ASA: III  Anesthesia Plan: General   Post-op Pain Management:    Induction: Intravenous  PONV Risk Score and Plan: 3 and Ondansetron, Dexamethasone and Scopolamine patch - Pre-op  Airway Management Planned: Oral ETT  Additional Equipment: None  Intra-op Plan:   Post-operative Plan: Extubation in OR  Informed Consent: I have reviewed the patients History and Physical, chart, labs and discussed the procedure including the risks, benefits and alternatives for the proposed anesthesia with the patient or authorized representative who has indicated his/her understanding and acceptance.     Dental advisory given  Plan Discussed with: CRNA and Surgeon  Anesthesia Plan Comments:        Anesthesia Quick Evaluation

## 2018-12-24 ENCOUNTER — Encounter (HOSPITAL_COMMUNITY)
Admission: RE | Disposition: A | Discharge status: Home / Self Care | Payer: Self-pay | Source: Home / Self Care | Attending: General Surgery

## 2018-12-24 ENCOUNTER — Other Ambulatory Visit: Payer: Self-pay

## 2018-12-24 ENCOUNTER — Encounter (HOSPITAL_COMMUNITY): Payer: Self-pay | Admitting: Emergency Medicine

## 2018-12-24 ENCOUNTER — Inpatient Hospital Stay (HOSPITAL_COMMUNITY)
Admission: RE | Admit: 2018-12-24 | Discharge: 2018-12-25 | DRG: 621 | Disposition: A | Discharge status: Home / Self Care | Payer: Federal, State, Local not specified - PPO | Attending: General Surgery | Admitting: General Surgery

## 2018-12-24 ENCOUNTER — Inpatient Hospital Stay (HOSPITAL_COMMUNITY): Payer: Federal, State, Local not specified - PPO | Admitting: Anesthesiology

## 2018-12-24 ENCOUNTER — Inpatient Hospital Stay (HOSPITAL_COMMUNITY): Payer: Federal, State, Local not specified - PPO | Admitting: Physician Assistant

## 2018-12-24 DIAGNOSIS — E669 Obesity, unspecified: Secondary | ICD-10-CM | POA: Diagnosis present

## 2018-12-24 DIAGNOSIS — Z975 Presence of (intrauterine) contraceptive device: Secondary | ICD-10-CM | POA: Diagnosis not present

## 2018-12-24 DIAGNOSIS — G8929 Other chronic pain: Secondary | ICD-10-CM | POA: Diagnosis present

## 2018-12-24 DIAGNOSIS — E1169 Type 2 diabetes mellitus with other specified complication: Secondary | ICD-10-CM | POA: Diagnosis present

## 2018-12-24 DIAGNOSIS — Z9049 Acquired absence of other specified parts of digestive tract: Secondary | ICD-10-CM

## 2018-12-24 DIAGNOSIS — Z6837 Body mass index (BMI) 37.0-37.9, adult: Secondary | ICD-10-CM

## 2018-12-24 DIAGNOSIS — Z79899 Other long term (current) drug therapy: Secondary | ICD-10-CM | POA: Diagnosis not present

## 2018-12-24 DIAGNOSIS — Z9884 Bariatric surgery status: Secondary | ICD-10-CM

## 2018-12-24 DIAGNOSIS — I10 Essential (primary) hypertension: Secondary | ICD-10-CM | POA: Diagnosis present

## 2018-12-24 DIAGNOSIS — M545 Low back pain, unspecified: Secondary | ICD-10-CM | POA: Diagnosis present

## 2018-12-24 DIAGNOSIS — Z833 Family history of diabetes mellitus: Secondary | ICD-10-CM | POA: Diagnosis not present

## 2018-12-24 DIAGNOSIS — Z20828 Contact with and (suspected) exposure to other viral communicable diseases: Secondary | ICD-10-CM | POA: Diagnosis not present

## 2018-12-24 DIAGNOSIS — E7800 Pure hypercholesterolemia, unspecified: Secondary | ICD-10-CM | POA: Diagnosis not present

## 2018-12-24 DIAGNOSIS — E119 Type 2 diabetes mellitus without complications: Secondary | ICD-10-CM | POA: Diagnosis present

## 2018-12-24 DIAGNOSIS — Z8249 Family history of ischemic heart disease and other diseases of the circulatory system: Secondary | ICD-10-CM | POA: Diagnosis not present

## 2018-12-24 DIAGNOSIS — Z23 Encounter for immunization: Secondary | ICD-10-CM | POA: Diagnosis not present

## 2018-12-24 DIAGNOSIS — Z7984 Long term (current) use of oral hypoglycemic drugs: Secondary | ICD-10-CM

## 2018-12-24 DIAGNOSIS — Z791 Long term (current) use of non-steroidal anti-inflammatories (NSAID): Secondary | ICD-10-CM

## 2018-12-24 HISTORY — PX: LAPAROSCOPIC GASTRIC SLEEVE RESECTION: SHX5895

## 2018-12-24 LAB — PREGNANCY, URINE: Preg Test, Ur: NEGATIVE

## 2018-12-24 LAB — GLUCOSE, CAPILLARY
Glucose-Capillary: 103 mg/dL — ABNORMAL HIGH (ref 70–99)
Glucose-Capillary: 152 mg/dL — ABNORMAL HIGH (ref 70–99)
Glucose-Capillary: 143 mg/dL — ABNORMAL HIGH (ref 70–99)
Glucose-Capillary: 156 mg/dL — ABNORMAL HIGH (ref 70–99)
Glucose-Capillary: 132 mg/dL — ABNORMAL HIGH (ref 70–99)

## 2018-12-24 LAB — HEMOGLOBIN AND HEMATOCRIT, BLOOD
HCT: 35.2 % — ABNORMAL LOW (ref 36.0–46.0)
Hemoglobin: 11.2 g/dL — ABNORMAL LOW (ref 12.0–15.0)

## 2018-12-24 LAB — TYPE AND SCREEN
ABO/RH(D): O POS
Antibody Screen: NEGATIVE

## 2018-12-24 SURGERY — GASTRECTOMY, SLEEVE, LAPAROSCOPIC
Anesthesia: General | Site: Abdomen

## 2018-12-24 MED ORDER — SODIUM CHLORIDE (PF) 0.9 % IJ SOLN
INTRAMUSCULAR | Status: AC
  Filled 2018-12-24: qty 50

## 2018-12-24 MED ORDER — ONDANSETRON HCL 4 MG/2ML IJ SOLN
INTRAMUSCULAR | Status: AC
  Filled 2018-12-24: qty 2

## 2018-12-24 MED ORDER — KETAMINE HCL 10 MG/ML IJ SOLN
INTRAMUSCULAR | Status: DC | PRN
  Administered 2018-12-24: 50 mg via INTRAVENOUS

## 2018-12-24 MED ORDER — CHLORHEXIDINE GLUCONATE 4 % EX LIQD: 60.0000 mL | Freq: Once | CUTANEOUS | Status: DC

## 2018-12-24 MED ORDER — INFLUENZA VAC SPLIT QUAD 0.5 ML IM SUSY
0.5000 mL | PREFILLED_SYRINGE | INTRAMUSCULAR | Status: AC
  Administered 2018-12-25: 0.5 mL via INTRAMUSCULAR
  Filled 2018-12-24: qty 0.5

## 2018-12-24 MED ORDER — ACETAMINOPHEN 160 MG/5ML PO SOLN
1000.0000 mg | Freq: Three times a day (TID) | ORAL | Status: DC
  Administered 2018-12-24 – 2018-12-25 (×2): 1000 mg via ORAL
  Filled 2018-12-24 (×2): qty 40.6

## 2018-12-24 MED ORDER — FENTANYL CITRATE (PF) 250 MCG/5ML IJ SOLN
INTRAMUSCULAR | Status: AC
  Filled 2018-12-24: qty 5

## 2018-12-24 MED ORDER — SCOPOLAMINE 1 MG/3DAYS TD PT72: 1.0000 | MEDICATED_PATCH | Freq: Once | TRANSDERMAL | Status: DC

## 2018-12-24 MED ORDER — AMLODIPINE BESYLATE 5 MG PO TABS
5.0000 mg | ORAL_TABLET | Freq: Every day | ORAL | Status: DC
  Administered 2018-12-25: 5 mg via ORAL
  Filled 2018-12-24: qty 1

## 2018-12-24 MED ORDER — GABAPENTIN 100 MG PO CAPS
200.0000 mg | ORAL_CAPSULE | Freq: Two times a day (BID) | ORAL | Status: DC
  Administered 2018-12-24 – 2018-12-25 (×2): 200 mg via ORAL
  Filled 2018-12-24 (×2): qty 2

## 2018-12-24 MED ORDER — GABAPENTIN 300 MG PO CAPS
300.0000 mg | ORAL_CAPSULE | ORAL | Status: AC
  Administered 2018-12-24: 300 mg via ORAL
  Filled 2018-12-24: qty 1

## 2018-12-24 MED ORDER — BUPIVACAINE LIPOSOME 1.3 % IJ SUSP
INTRAMUSCULAR | Status: DC | PRN
  Administered 2018-12-24: 20 mL

## 2018-12-24 MED ORDER — ENSURE MAX PROTEIN PO LIQD
2.0000 [oz_av] | ORAL | Status: DC
  Administered 2018-12-25 (×3): 2 [oz_av] via ORAL

## 2018-12-24 MED ORDER — POTASSIUM CHLORIDE IN NACL 20-0.45 MEQ/L-% IV SOLN
INTRAVENOUS | Status: DC
  Administered 2018-12-24 – 2018-12-25 (×3): via INTRAVENOUS
  Filled 2018-12-24 (×3): qty 1000

## 2018-12-24 MED ORDER — EPHEDRINE SULFATE-NACL 50-0.9 MG/10ML-% IV SOSY
PREFILLED_SYRINGE | INTRAVENOUS | Status: DC | PRN
  Administered 2018-12-24 (×3): 10 mg via INTRAVENOUS

## 2018-12-24 MED ORDER — MIDAZOLAM HCL 2 MG/2ML IJ SOLN
INTRAMUSCULAR | Status: DC | PRN
  Administered 2018-12-24: 2 mg via INTRAVENOUS

## 2018-12-24 MED ORDER — FLUOXETINE HCL 20 MG PO CAPS
40.0000 mg | ORAL_CAPSULE | Freq: Every day | ORAL | Status: DC
  Administered 2018-12-25: 40 mg via ORAL
  Filled 2018-12-24: qty 2

## 2018-12-24 MED ORDER — PANTOPRAZOLE SODIUM 40 MG IV SOLR
40.0000 mg | Freq: Every day | INTRAVENOUS | Status: DC
  Administered 2018-12-24: 40 mg via INTRAVENOUS
  Filled 2018-12-24: qty 40

## 2018-12-24 MED ORDER — 0.9 % SODIUM CHLORIDE (POUR BTL) OPTIME
TOPICAL | Status: DC | PRN
  Administered 2018-12-24: 1000 mL

## 2018-12-24 MED ORDER — SUCCINYLCHOLINE CHLORIDE 200 MG/10ML IV SOSY
PREFILLED_SYRINGE | INTRAVENOUS | Status: AC
  Filled 2018-12-24: qty 10

## 2018-12-24 MED ORDER — ACETAMINOPHEN 500 MG PO TABS
1000.0000 mg | ORAL_TABLET | Freq: Three times a day (TID) | ORAL | Status: DC
  Filled 2018-12-24: qty 2

## 2018-12-24 MED ORDER — PROPOFOL 10 MG/ML IV BOLUS
INTRAVENOUS | Status: DC | PRN
  Administered 2018-12-24: 160 mg via INTRAVENOUS

## 2018-12-24 MED ORDER — ONDANSETRON HCL 4 MG/2ML IJ SOLN: 4.0000 mg | Freq: Four times a day (QID) | INTRAMUSCULAR | Status: DC | PRN

## 2018-12-24 MED ORDER — DEXAMETHASONE SODIUM PHOSPHATE 4 MG/ML IJ SOLN: 4.0000 mg | INTRAMUSCULAR | Status: DC

## 2018-12-24 MED ORDER — APREPITANT 40 MG PO CAPS
40.0000 mg | ORAL_CAPSULE | ORAL | Status: AC
  Administered 2018-12-24: 40 mg via ORAL
  Filled 2018-12-24: qty 1

## 2018-12-24 MED ORDER — ONDANSETRON HCL 4 MG/2ML IJ SOLN
INTRAMUSCULAR | Status: DC | PRN
  Administered 2018-12-24: 4 mg via INTRAVENOUS

## 2018-12-24 MED ORDER — MEPERIDINE HCL 50 MG/ML IJ SOLN: 6.2500 mg | INTRAMUSCULAR | Status: DC | PRN

## 2018-12-24 MED ORDER — MORPHINE SULFATE (PF) 4 MG/ML IV SOLN: 1.0000 mg | INTRAVENOUS | Status: DC | PRN

## 2018-12-24 MED ORDER — PROMETHAZINE HCL 25 MG/ML IJ SOLN
6.2500 mg | INTRAMUSCULAR | Status: DC | PRN
  Administered 2018-12-24: 6.25 mg via INTRAVENOUS

## 2018-12-24 MED ORDER — SCOPOLAMINE 1 MG/3DAYS TD PT72
1.0000 | MEDICATED_PATCH | TRANSDERMAL | Status: DC
  Administered 2018-12-24: 1.5 mg via TRANSDERMAL
  Filled 2018-12-24: qty 1

## 2018-12-24 MED ORDER — HYDROMORPHONE HCL 1 MG/ML IJ SOLN
0.2500 mg | INTRAMUSCULAR | Status: DC | PRN
  Administered 2018-12-24: 0.5 mg via INTRAVENOUS

## 2018-12-24 MED ORDER — OXYCODONE HCL 5 MG/5ML PO SOLN
5.0000 mg | Freq: Four times a day (QID) | ORAL | Status: DC | PRN
  Administered 2018-12-24: 5 mg via ORAL
  Filled 2018-12-24: qty 5

## 2018-12-24 MED ORDER — ROCURONIUM BROMIDE 10 MG/ML (PF) SYRINGE
PREFILLED_SYRINGE | INTRAVENOUS | Status: DC | PRN
  Administered 2018-12-24: 60 mg via INTRAVENOUS
  Administered 2018-12-24: 20 mg via INTRAVENOUS

## 2018-12-24 MED ORDER — FENTANYL CITRATE (PF) 250 MCG/5ML IJ SOLN
INTRAMUSCULAR | Status: DC | PRN
  Administered 2018-12-24: 100 ug via INTRAVENOUS
  Administered 2018-12-24 (×2): 50 ug via INTRAVENOUS

## 2018-12-24 MED ORDER — PROMETHAZINE HCL 25 MG/ML IJ SOLN
INTRAMUSCULAR | Status: AC
  Filled 2018-12-24: qty 1

## 2018-12-24 MED ORDER — LIDOCAINE 2% (20 MG/ML) 5 ML SYRINGE
INTRAMUSCULAR | Status: DC | PRN
  Administered 2018-12-24: 1.5 mg/kg/h via INTRAVENOUS

## 2018-12-24 MED ORDER — ACETAMINOPHEN 500 MG PO TABS
1000.0000 mg | ORAL_TABLET | ORAL | Status: AC
  Administered 2018-12-24: 1000 mg via ORAL
  Filled 2018-12-24: qty 2

## 2018-12-24 MED ORDER — DEXAMETHASONE SODIUM PHOSPHATE 10 MG/ML IJ SOLN
INTRAMUSCULAR | Status: AC
  Filled 2018-12-24: qty 1

## 2018-12-24 MED ORDER — STERILE WATER FOR IRRIGATION IR SOLN
Status: DC | PRN
  Administered 2018-12-24: 1000 mL

## 2018-12-24 MED ORDER — PROPOFOL 10 MG/ML IV BOLUS
INTRAVENOUS | Status: AC
  Filled 2018-12-24: qty 20

## 2018-12-24 MED ORDER — LACTATED RINGERS IR SOLN
Status: DC | PRN
  Administered 2018-12-24: 1000 mL

## 2018-12-24 MED ORDER — MIDAZOLAM HCL 2 MG/2ML IJ SOLN
INTRAMUSCULAR | Status: AC
  Filled 2018-12-24: qty 2

## 2018-12-24 MED ORDER — SIMETHICONE 80 MG PO CHEW: 80.0000 mg | CHEWABLE_TABLET | Freq: Four times a day (QID) | ORAL | Status: DC | PRN

## 2018-12-24 MED ORDER — INSULIN ASPART 100 UNIT/ML ~~LOC~~ SOLN
0.0000 [IU] | SUBCUTANEOUS | Status: DC
  Administered 2018-12-24: 3 [IU] via SUBCUTANEOUS
  Administered 2018-12-24 – 2018-12-25 (×3): 2 [IU] via SUBCUTANEOUS

## 2018-12-24 MED ORDER — SODIUM CHLORIDE (PF) 0.9 % IJ SOLN
INTRAMUSCULAR | Status: DC | PRN
  Administered 2018-12-24: 50 mL

## 2018-12-24 MED ORDER — SODIUM CHLORIDE 0.9 % IV SOLN
2.0000 g | INTRAVENOUS | Status: AC
  Administered 2018-12-24: 2 g via INTRAVENOUS
  Filled 2018-12-24: qty 2

## 2018-12-24 MED ORDER — IRBESARTAN 150 MG PO TABS
150.0000 mg | ORAL_TABLET | Freq: Every day | ORAL | Status: DC
  Administered 2018-12-25: 150 mg via ORAL
  Filled 2018-12-24: qty 1

## 2018-12-24 MED ORDER — ROCURONIUM BROMIDE 10 MG/ML (PF) SYRINGE
PREFILLED_SYRINGE | INTRAVENOUS | Status: AC
  Filled 2018-12-24: qty 10

## 2018-12-24 MED ORDER — SUGAMMADEX SODIUM 500 MG/5ML IV SOLN
INTRAVENOUS | Status: AC
  Filled 2018-12-24: qty 5

## 2018-12-24 MED ORDER — LIDOCAINE HCL 2 % IJ SOLN
INTRAMUSCULAR | Status: AC
  Filled 2018-12-24: qty 20

## 2018-12-24 MED ORDER — LIDOCAINE 2% (20 MG/ML) 5 ML SYRINGE
INTRAMUSCULAR | Status: AC
  Filled 2018-12-24: qty 5

## 2018-12-24 MED ORDER — HEPARIN SODIUM (PORCINE) 5000 UNIT/ML IJ SOLN
5000.0000 [IU] | INTRAMUSCULAR | Status: AC
  Administered 2018-12-24: 5000 [IU] via SUBCUTANEOUS
  Filled 2018-12-24: qty 1

## 2018-12-24 MED ORDER — LACTATED RINGERS IV SOLN
INTRAVENOUS | Status: DC
  Administered 2018-12-24: 06:00:00 via INTRAVENOUS

## 2018-12-24 MED ORDER — DEXAMETHASONE SODIUM PHOSPHATE 10 MG/ML IJ SOLN
INTRAMUSCULAR | Status: DC | PRN
  Administered 2018-12-24: 10 mg via INTRAVENOUS

## 2018-12-24 MED ORDER — KETAMINE HCL 10 MG/ML IJ SOLN
INTRAMUSCULAR | Status: AC
  Filled 2018-12-24: qty 1

## 2018-12-24 MED ORDER — ENOXAPARIN SODIUM 30 MG/0.3ML ~~LOC~~ SOLN
30.0000 mg | Freq: Two times a day (BID) | SUBCUTANEOUS | Status: DC
  Administered 2018-12-24 – 2018-12-25 (×2): 30 mg via SUBCUTANEOUS
  Filled 2018-12-24 (×2): qty 0.3

## 2018-12-24 MED ORDER — MIDAZOLAM HCL 2 MG/2ML IJ SOLN: 0.5000 mg | Freq: Once | INTRAMUSCULAR | Status: DC | PRN

## 2018-12-24 MED ORDER — LIDOCAINE 2% (20 MG/ML) 5 ML SYRINGE
INTRAMUSCULAR | Status: DC | PRN
  Administered 2018-12-24: 100 mg via INTRAVENOUS

## 2018-12-24 MED ORDER — SUGAMMADEX SODIUM 200 MG/2ML IV SOLN
INTRAVENOUS | Status: DC | PRN
  Administered 2018-12-24: 200 mg via INTRAVENOUS

## 2018-12-24 MED ORDER — HYDRALAZINE HCL 10 MG PO TABS: 10.0000 mg | ORAL_TABLET | Freq: Three times a day (TID) | ORAL | Status: DC | PRN

## 2018-12-24 MED ORDER — HYDROMORPHONE HCL 1 MG/ML IJ SOLN
INTRAMUSCULAR | Status: AC
  Filled 2018-12-24: qty 1

## 2018-12-24 SURGICAL SUPPLY — 79 items
APL PRP STRL LF DISP 70% ISPRP (MISCELLANEOUS) ×2
APL SKNCLS STERI-STRIP NONHPOA (GAUZE/BANDAGES/DRESSINGS) ×1
APL SRG 32X5 SNPLK LF DISP (MISCELLANEOUS)
APL SWBSTK 6 STRL LF DISP (MISCELLANEOUS)
APPLICATOR COTTON TIP 6 STRL (MISCELLANEOUS) IMPLANT
APPLICATOR COTTON TIP 6IN STRL (MISCELLANEOUS)
APPLIER CLIP ROT 10 11.4 M/L (STAPLE)
APPLIER CLIP ROT 13.4 12 LRG (CLIP)
APR CLP LRG 13.4X12 ROT 20 MLT (CLIP)
APR CLP MED LRG 11.4X10 (STAPLE)
BENZOIN TINCTURE PRP APPL 2/3 (GAUZE/BANDAGES/DRESSINGS) ×2 IMPLANT
BLADE SURG SZ11 CARB STEEL (BLADE) ×2 IMPLANT
BNDG ADH 1X3 SHEER STRL LF (GAUZE/BANDAGES/DRESSINGS) ×12 IMPLANT
BNDG ADH THN 3X1 STRL LF (GAUZE/BANDAGES/DRESSINGS) ×6
CABLE HIGH FREQUENCY MONO STRZ (ELECTRODE) ×1 IMPLANT
CHLORAPREP W/TINT 26 (MISCELLANEOUS) ×4 IMPLANT
CLIP APPLIE ROT 10 11.4 M/L (STAPLE) IMPLANT
CLIP APPLIE ROT 13.4 12 LRG (CLIP) IMPLANT
COVER SURGICAL LIGHT HANDLE (MISCELLANEOUS) ×2 IMPLANT
COVER WAND RF STERILE (DRAPES) IMPLANT
DECANTER SPIKE VIAL GLASS SM (MISCELLANEOUS) ×2 IMPLANT
DEVICE SUT QUICK LOAD TK 5 (STAPLE) IMPLANT
DEVICE SUT TI-KNOT TK 5X26 (MISCELLANEOUS) IMPLANT
DEVICE SUTURE ENDOST 10MM (ENDOMECHANICALS) IMPLANT
DISSECTOR BLUNT TIP ENDO 5MM (MISCELLANEOUS) IMPLANT
DRAPE UTILITY XL STRL (DRAPES) ×4 IMPLANT
ELECT L-HOOK LAP 45CM DISP (ELECTROSURGICAL)
ELECT REM PT RETURN 15FT ADLT (MISCELLANEOUS) ×2 IMPLANT
ELECTRODE L-HOOK LAP 45CM DISP (ELECTROSURGICAL) IMPLANT
GAUZE SPONGE 2X2 8PLY STRL LF (GAUZE/BANDAGES/DRESSINGS) IMPLANT
GAUZE SPONGE 4X4 12PLY STRL (GAUZE/BANDAGES/DRESSINGS) IMPLANT
GLOVE BIO SURGEON STRL SZ7.5 (GLOVE) ×2 IMPLANT
GLOVE INDICATOR 8.0 STRL GRN (GLOVE) ×2 IMPLANT
GOWN STRL REUS W/TWL XL LVL3 (GOWN DISPOSABLE) ×6 IMPLANT
GRASPER SUT TROCAR 14GX15 (MISCELLANEOUS) ×2 IMPLANT
HOVERMATT SINGLE USE (MISCELLANEOUS) ×2 IMPLANT
KIT BASIN OR (CUSTOM PROCEDURE TRAY) ×2 IMPLANT
KIT TURNOVER KIT A (KITS) IMPLANT
MARKER SKIN DUAL TIP RULER LAB (MISCELLANEOUS) ×2 IMPLANT
NDL SPNL 22GX3.5 QUINCKE BK (NEEDLE) ×1 IMPLANT
NEEDLE SPNL 22GX3.5 QUINCKE BK (NEEDLE) ×2 IMPLANT
PACK UNIVERSAL I (CUSTOM PROCEDURE TRAY) ×2 IMPLANT
PENCIL SMOKE EVACUATOR (MISCELLANEOUS) IMPLANT
RELOAD STAPLE 60 3.6 BLU REG (STAPLE) ×1 IMPLANT
RELOAD STAPLE 60 3.8 GOLD REG (STAPLE) IMPLANT
RELOAD STAPLE 60 4.1 GRN THCK (STAPLE) ×1 IMPLANT
RELOAD STAPLE 60 BLK VRY/THCK (STAPLE) IMPLANT
RELOAD STAPLER 60MM BLK (STAPLE) IMPLANT
RELOAD STAPLER BLUE 60MM (STAPLE) ×4 IMPLANT
RELOAD STAPLER GOLD 60MM (STAPLE) ×1 IMPLANT
RELOAD STAPLER GREEN 60MM (STAPLE) ×1 IMPLANT
SCISSORS LAP 5X45 EPIX DISP (ENDOMECHANICALS) IMPLANT
SEALANT SURGICAL APPL DUAL CAN (MISCELLANEOUS) IMPLANT
SET IRRIG TUBING LAPAROSCOPIC (IRRIGATION / IRRIGATOR) ×2 IMPLANT
SET TUBE SMOKE EVAC HIGH FLOW (TUBING) ×2 IMPLANT
SHEARS HARMONIC ACE PLUS 45CM (MISCELLANEOUS) ×2 IMPLANT
SLEEVE GASTRECTOMY 40FR VISIGI (MISCELLANEOUS) ×2 IMPLANT
SLEEVE XCEL OPT CAN 5 100 (ENDOMECHANICALS) ×6 IMPLANT
SOL ANTI FOG 6CC (MISCELLANEOUS) ×1 IMPLANT
SOLUTION ANTI FOG 6CC (MISCELLANEOUS) ×1
SPONGE GAUZE 2X2 STER 10/PKG (GAUZE/BANDAGES/DRESSINGS)
SPONGE LAP 18X18 RF (DISPOSABLE) ×2 IMPLANT
STAPLER ECHELON BIOABSB 60 FLE (MISCELLANEOUS) ×9 IMPLANT
STAPLER ECHELON LONG 60 440 (INSTRUMENTS) ×2 IMPLANT
STAPLER RELOAD 60MM BLK (STAPLE)
STAPLER RELOAD BLUE 60MM (STAPLE) ×8
STAPLER RELOAD GOLD 60MM (STAPLE) ×2
STAPLER RELOAD GREEN 60MM (STAPLE) ×2
STRIP CLOSURE SKIN 1/2X4 (GAUZE/BANDAGES/DRESSINGS) ×2 IMPLANT
SUT MNCRL AB 4-0 PS2 18 (SUTURE) ×2 IMPLANT
SUT SURGIDAC NAB ES-9 0 48 120 (SUTURE) IMPLANT
SUT VICRYL 0 TIES 12 18 (SUTURE) ×2 IMPLANT
SYR 20ML LL LF (SYRINGE) ×2 IMPLANT
TOWEL OR 17X26 10 PK STRL BLUE (TOWEL DISPOSABLE) ×2 IMPLANT
TOWEL OR NON WOVEN STRL DISP B (DISPOSABLE) ×2 IMPLANT
TROCAR BLADELESS 15MM (ENDOMECHANICALS) ×2 IMPLANT
TROCAR BLADELESS OPT 5 100 (ENDOMECHANICALS) ×2 IMPLANT
TUBING CONNECTING 10 (TUBING) ×3 IMPLANT
TUBING ENDO SMARTCAP (MISCELLANEOUS) ×2 IMPLANT

## 2018-12-24 NOTE — H&P (Signed)
[name] is an 53 y.o. female.   Chief Complaint: here for surgery HPI: 53 yo here for LSG.  Denies medical changes  The patient is a 53 year old female who presents for a bariatric surgery evaluation. She comes in for long-term follow-up for her weight as well as her comorbidities. I initially met her in July 2019. Her weight at that time was 216 pounds with a BMI of 36. She denies any medical changes other than a brief stint or episodes of dizziness back in late August. It prompted a trip to the emergency room. She also had some left ear pain at that time. She had a myxomatosis of around 13,000 otherwise her labs were normal except for some mild hypokalemia. Her CT head was negative. She is no longer having those symptoms. She had an upper GI and a chest x-ray as part of our bariatric surgery screening. They were unremarkable. Bariatric evaluation labs were also unremarkable. Her most recent hemoglobin A1c was 6.5 which is somewhat improved. She had normal comprehensive metabolic panel. We reviewed her other labs as well. She denies any chest pain, chest pressure, source of breath, orthopnea, paroxysmal nocturnal dyspnea, melena or hematochezia. She denies any dysuria. She denies any heartburn or reflux.  Past Medical History:  Diagnosis Date  . Complication of anesthesia 1997   facial swelling postop due to anesthesia per pt cyst removal from head  . Diabetes mellitus without complication ([place])    type 2  . High cholesterol   . Hypertension   . Lumbar disc disorder    lower lumbar    Past Surgical History:  Procedure Laterality Date  . BREAST SURGERY Bilateral 2019 may   reduction  . CHOLECYSTECTOMY    . CYST EXCISION  1997   from scalp  . DILATION AND CURETTAGE OF UTERUS    . hysterscopy  2014    Family History  Problem Relation Age of Onset  . Diabetes Mother   . Hypertension Mother   . Diabetes Father   . Hypertension Father    Social History:   reports that she has never smoked. She has never used smokeless tobacco. She reports previous alcohol use. She reports that she does not use drugs.  Allergies: No Known Allergies  Medications Prior to Admission  Medication Sig Dispense Refill  . amLODipine (NORVASC) 5 MG tablet Take 5 mg by mouth daily.    Marland Kitchen CALCIUM PO Take 1 tablet by mouth daily.    Marland Kitchen FLUoxetine (PROZAC) 40 MG capsule Take 40 mg by mouth daily.    . hydrochlorothiazide (HYDRODIURIL) 12.5 MG tablet Take 12.5 mg by mouth daily.    Marland Kitchen ibuprofen (ADVIL) 200 MG tablet Take 400 mg by mouth every 8 (eight) hours as needed (pain.).    Marland Kitchen metFORMIN (GLUCOPHAGE) 1000 MG tablet Take 1,000 mg by mouth 2 (two) times daily.    . Multiple Vitamin (MULTIVITAMIN WITH MINERALS) TABS tablet Take 1 tablet by mouth daily.    . simvastatin (ZOCOR) 40 MG tablet Take 40 mg by mouth every evening.    . triamcinolone cream (KENALOG) 0.1 % Apply 1 application topically 2 (two) times daily as needed.    . valsartan (DIOVAN) 160 MG tablet Take 160 mg by mouth daily.    Marland Kitchen levonorgestrel (MIRENA) 20 MCG/24HR IUD 1 each by Intrauterine route once. Inserted 2017    . meclizine (ANTIVERT) 25 MG tablet Take 1 tablet (25 mg total) by mouth 3 (three) times daily as  needed for dizziness. (Patient not taking: Reported on 12/17/2018) 15 tablet 0    Results for orders placed or performed during the hospital encounter of 12/24/18 (from the past 48 hour(s))  Pregnancy, urine     Status: None   Collection Time: 12/24/18  5:19 AM  Result Value Ref Range   Preg Test, Ur NEGATIVE NEGATIVE    Comment:        THE SENSITIVITY OF THIS METHODOLOGY IS >20 mIU/mL. Performed at [facility], [place] [address] [place], [place] [address]   Glucose, capillary     Status: Abnormal   Collection Time: 12/24/18  5:26 AM  Result Value Ref Range   Glucose-Capillary 103 (H) 70 - 99 mg/dL   Comment 1 Notify RN    Comment 2 Document in Chart    No results  found.  Review of Systems  All other systems reviewed and are negative.   Blood pressure (!) 146/89, pulse 85, temperature 98.7 F (37.1 C), temperature source Oral, resp. rate 18, height '5\' 4"'$  (1.626 m), weight 97 kg, SpO2 100 %. Physical Exam  Vitals reviewed. Constitutional: She is oriented to person, place, and time. She appears well-developed and well-nourished. No distress.  HENT:  Head: Normocephalic and atraumatic.  Right Ear: External ear normal.  Left Ear: External ear normal.  Eyes: Conjunctivae are normal. No scleral icterus.  Neck: Normal range of motion. Neck supple. No tracheal deviation present. No thyromegaly present.  Cardiovascular: Normal rate and normal heart sounds.  Respiratory: Effort normal and breath sounds normal. No stridor. No respiratory distress. She has no wheezes.  GI: Soft. She exhibits no distension. There is no abdominal tenderness.  Musculoskeletal:        General: No tenderness or edema.  Lymphadenopathy:    She has no cervical adenopathy.  Neurological: She is alert and oriented to person, place, and time. She exhibits normal muscle tone.  Skin: Skin is warm and dry. No rash noted. She is not diaphoretic. No erythema. No pallor.  Psychiatric: She has a normal mood and affect. Her behavior is normal. Judgment and thought content normal.     Assessment/Plan Obesity htn  For lsg  Eras     Greer Pickerel, MD 12/24/2018, 7:29 AM

## 2018-12-24 NOTE — Progress Notes (Signed)
PHARMACY CONSULT FOR:  Risk Assessment for Post-Discharge VTE Following Bariatric Surgery  Post-Discharge VTE Risk Assessment: This patient's probability of 30-day post-discharge VTE is increased due to the factors marked:   Female    Age >/=60 years    BMI >/=50 kg/m2    CHF    Dyspnea at Rest    Paraplegia  X  Non-gastric-band surgery    Operation Time >/=3 hr    Return to OR     Length of Stay >/= 3 d      Hx of VTE   Hypercoagulable condition   Significant venous stasis   Predicted probability of 30-day post-discharge VTE: 0.16%  Other patient-specific factors to consider:  none   Recommendation for Discharge: No pharmacologic prophylaxis post-discharge  [name] is a 53 y.o. 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  Recent Labs    12/24/18 1004  HGB 11.2*  HCT 35.2*   Estimated Creatinine Clearance: 85.5 mL/min (by C-G formula based on SCr of 0.89 mg/dL).    Past Medical History:  Diagnosis Date  . Complication of anesthesia 1997   facial swelling postop due to anesthesia per pt cyst removal from head  . Diabetes mellitus without complication ([place])    type 2  . High cholesterol   . Hypertension   . Lumbar disc disorder    lower lumbar     Medications Prior to Admission  Medication Sig Dispense Refill Last Dose  . amLODipine (NORVASC) 5 MG tablet Take 5 mg by mouth daily.   12/24/2018 at 0400  . CALCIUM PO Take 1 tablet by mouth daily.   12/23/2018 at Unknown time  . FLUoxetine (PROZAC) 40 MG capsule Take 40 mg by mouth daily.   12/24/2018 at 0400  . hydrochlorothiazide (HYDRODIURIL) 12.5 MG tablet Take 12.5 mg by mouth daily.   12/23/2018 at Unknown time  . ibuprofen (ADVIL) 200 MG tablet Take 400 mg by mouth every 8 (eight) hours as needed (pain.).   Past Week  at Unknown time  . metFORMIN (GLUCOPHAGE) 1000 MG tablet Take 1,000 mg by mouth 2 (two) times daily.   12/23/2018 at Unknown time  . Multiple Vitamin (MULTIVITAMIN WITH MINERALS) TABS tablet Take 1 tablet by mouth daily.   12/23/2018 at Unknown time  . simvastatin (ZOCOR) 40 MG tablet Take 40 mg by mouth every evening.   12/23/2018 at Unknown time  . triamcinolone cream (KENALOG) 0.1 % Apply 1 application topically 2 (two) times daily as needed.   Past Month at Unknown time  . valsartan (DIOVAN) 160 MG tablet Take 160 mg by mouth daily.   12/23/2018 at Unknown time  . levonorgestrel (MIRENA) 20 MCG/24HR IUD 1 each by Intrauterine route once. Inserted 2017   More than a month at Unknown time  . meclizine (ANTIVERT) 25 MG tablet Take 1 tablet (25 mg total) by mouth 3 (three) times daily as needed for dizziness. (Patient not taking: Reported on 12/17/2018) 15 tablet 0 Not Taking at Unknown time      Eudelia Bunch, Pharm.D [phone] 12/24/2018 11:27 AM

## 2018-12-24 NOTE — Op Note (Signed)
12/24/2018 Glendale Chard 1965/10/04 [id number]   PRE-OPERATIVE DIAGNOSIS:     Obesity (BMI 37)   Lumbosacral pain, chronic   Essential hypertension   Diabetes mellitus type 2 in obese (HCC)   Hypercholesteremia  POST-OPERATIVE DIAGNOSIS:  same  PROCEDURE:  Procedure(s): LAPAROSCOPIC SLEEVE GASTRECTOMY  UPPER GI ENDOSCOPY  SURGEON:  Surgeon(s): Gayland Curry, MD FACS FASMBS  ASSISTANTS: Johnathan Hausen MD FACS  ANESTHESIA:   general  DRAINS: none   BOUGIE: 40 fr ViSiGi  LOCAL MEDICATIONS USED:   Exparel  EBL: minimal  SPECIMEN:  Source of Specimen:  Greater curvature of stomach  DISPOSITION OF SPECIMEN:  PATHOLOGY  COUNTS:  YES  INDICATION FOR PROCEDURE: This is a very pleasant 53 y.o.-year-old morbidly obese female who has had unsuccessful attempts for sustained weight loss. The patient presents today for a planned laparoscopic sleeve gastrectomy with upper endoscopy. We have discussed the risk and benefits of the procedure extensively preoperatively. Please see my separate notes.  PROCEDURE: After obtaining informed consent and receiving 5000 units of subcutaneous heparin, the patient was brought to the operating room at [facility] and placed supine on the operating room table. General endotracheal anesthesia was established. Sequential compression devices were placed. A orogastric tube was placed. The patient's abdomen was prepped and draped in the usual standard surgical fashion. The patient received preoperative IV antibiotic. A surgical timeout was performed. ERAS protocol used.   Access to the abdomen was achieved using a 5 mm 0 laparoscope thru a 5 mm trocar In the left upper Quadrant 2 fingerbreadths below the left subcostal margin using the Optiview technique. Pneumoperitoneum was smoothly established up to 15 mm of mercury. The laparoscope was advanced and the abdominal cavity was surveilled. The patient was then placed in reverse Trendelenburg.   A 5 mm  trocar was placed slightly above and to the left of the umbilicus under direct visualization.  Had omental adhesions right upper quadrant to her taken down with harmonic scalpel.  [facility] liver retractor was placed under the left lobe of the liver through a 5 mm trocar incision site in the subxiphoid position. A 5 mm trocar was placed in the lateral right upper quadrant along with a 15 mm trocar in the mid right abdomen. A final 5 mm trocar was placed in the lateral LUQ.  All under direct visualization after exparel had been infiltrated in bilateral lateral upper abdominal walls as a TAP block.  The stomach was inspected. It was completely decompressed and the orogastric tube was removed.  There was no anterior dimple that was obviously visible.  Preop upper GI showed no hiatal hernia.   We identified the pylorus and measured 6 cm proximal to the pylorus and identified an area of where we would start taking down the short gastric vessels. Harmonic scalpel was used to take down the short gastric vessels along the greater curvature of the stomach. We were able to enter the lesser sac. We continued to march along the greater curvature of the stomach taking down the short gastrics. As we approached the gastrosplenic ligament we took care in this area not to injure the spleen. We were able to take down the entire gastrosplenic ligament. We then mobilized the fundus away from the left crus of diaphragm. There were not any significant posterior gastric avascular attachments. This left the stomach completely mobilized. No vessels had been taken down along the lesser curvature of the stomach.  We then reidentified the pylorus. A 40Fr ViSiGi  was then placed in the oropharynx and advanced down into the stomach and placed in the distal antrum and positioned along the lesser curvature. It was placed under suction which secured the 40Fr ViSiGi in place along the lesser curve. Then using the [employer]icon echelon 60 mm  stapler with a green load with Seamguard, I placed a stapler along the antrum approximately 5 cm from the pylorus. The stapler was angled so that there is ample room at the angularis incisura. I then fired the first staple load after inspecting it posteriorly to ensure adequate space both anteriorly and posteriorly. At this point I still was not completely past the angularis so with a gold load with Seamguard, I placed the stapler in position just inside the prior stapleline. We then rotated the stomach to insure that there was adequate anteriorly as well as posteriorly. The stapler was then fired.  At this point I started using 60 mm blue load staple cartridges with Seamguard. The echelon stapler was then repositioned with a 60 mm blue load with Seamguard and we continued to march up along the [place]. My assistant was holding traction along the greater curvature stomach along the cauterized short gastric vessels ensuring that the stomach was symmetrically retracted. Prior to each firing of the staple, we rotated the stomach to ensure that there is adequate stomach left.  As we approached the fundus, I used 60 mm blue cartridge with Seamguard aiming  lateral to the GE junction after mobilizing some of the esophageal fat pad.  The sleeve was inspected. There is no evidence of cork screw. The staple line appeared hemostatic. The CRNA inflated the ViSiGi to the green zone and the upper abdomen was flooded with saline. There were no bubbles. The sleeve was decompressed and the ViSiGi removed. My assistant scrubbed out and performed an upper endoscopy. The sleeve easily distended with air and the scope was easily advanced to the pylorus. There is no evidence of internal bleeding or cork screwing. There was no narrowing at the angularis. There is no evidence of bubbles. Please see his operative note for further details. The gastric sleeve was decompressed and the endoscope was removed.  The greater curvature the stomach  was grasped with a laparoscopic grasper and removed from the 15 mm trocar site.  The liver retractor was removed. I then closed the 15 mm trocar site with 1 interrupted 0 Vicryl sutures through the fascia using the endoclose. The closure was viewed laparoscopically and it was airtight. Remaining Exparel was then infiltrated in the preperitoneal spaces around the trocar sites. Pneumoperitoneum was released. All trocar sites were closed with a 4-0 Monocryl in a subcuticular fashion followed by the application of benzoin, steri-strips, and bandaids. The patient was extubated and taken to the recovery room in stable condition. All needle, instrument, and sponge counts were correct x2. There are no immediate complications  (1) 60 mm green with Seamguard (1) 60 mm gold with seamguard (3) 60 mm blue with 2 seamguard  PLAN OF CARE: Admit to inpatient   PATIENT DISPOSITION:  PACU - hemodynamically stable.   Delay start of Pharmacological VTE agent (>24hrs) due to surgical blood loss or risk of bleeding:  no  Leighton Ruff. Redmond Pulling, MD, FACS FASMBS General, Bariatric, & Minimally Invasive Surgery [facility], [place]

## 2018-12-24 NOTE — Op Note (Signed)
[name] [id number] 11-29-1965 12/24/2018  Preoperative diagnosis: sleeve gastrectomy in progress by Dr. Redmond Pulling  Postoperative diagnosis: Same   Procedure: Upper endoscopy   Surgeon: Catalina Antigua B. Hassell Done  M.D., FACS   Anesthesia: Gen.   Indications for procedure: This patient was undergoing a sleeve gastrectomy and we needed to check for cylindrical sleeve and for bleeding and leaks.    Description of procedure: The endoscopy was placed in the mouth and into the oropharynx and under endoscopic vision it was advanced to the esophagogastric junction.  The pouch was insufflated and the scope advanced into the antrum and visualized the pylorus.  The staple line was intact.  .   No bleeding or leaks were detected.  The scope was withdrawn without difficulty.     Matt B. Hassell Done, MD, FACS General, Bariatric, & Minimally Invasive Surgery [facility], [place]

## 2018-12-24 NOTE — Transfer of Care (Signed)
Immediate Anesthesia Transfer of Care Note  Patient: [name]  Procedure(s) Performed: LAPAROSCOPIC GASTRIC SLEEVE RESECTION, Upper Endo, ERAS Pathway (N/A Abdomen)  Patient Location: PACU  Anesthesia Type:General  Level of Consciousness: drowsy  Airway & Oxygen Therapy: Patient Spontanous Breathing and Patient connected to face mask oxygen  Post-op Assessment: Report given to RN and Post -op Vital signs reviewed and stable  Post vital signs: Reviewed and stable  Last Vitals:  Vitals Value Taken Time  BP 136/80 12/24/18 0926  Temp    Pulse 104 12/24/18 0927  Resp 21 12/24/18 0927  SpO2 97 % 12/24/18 0927  Vitals shown include unvalidated device data.  Last Pain:  Vitals:   12/24/18 0542  TempSrc:   PainSc: 0-No pain      Patients Stated Pain Goal: 4 ([id number])  Complications: No apparent anesthesia complications

## 2018-12-24 NOTE — Anesthesia Postprocedure Evaluation (Signed)
Anesthesia Post Note  Patient: [name]  Procedure(s) Performed: LAPAROSCOPIC GASTRIC SLEEVE RESECTION, Upper Endo, ERAS Pathway (N/A Abdomen)     Patient location during evaluation: PACU Anesthesia Type: General Level of consciousness: awake and alert, patient cooperative and oriented Pain management: pain level controlled Vital Signs Assessment: post-procedure vital signs reviewed and stable Respiratory status: spontaneous breathing, nonlabored ventilation and respiratory function stable Cardiovascular status: blood pressure returned to baseline and stable Postop Assessment: no apparent nausea or vomiting Anesthetic complications: no    Last Vitals:  Vitals:   12/24/18 1131 12/24/18 1233  BP: 120/71 (!) 116/59  Pulse: 92 91  Resp: 16 17  Temp:  36.7 C  SpO2: 94% 100%    Last Pain:  Vitals:   12/24/18 1233  TempSrc: Oral  PainSc:                  Rolanda Campa,E. Eyad Rochford

## 2018-12-24 NOTE — Progress Notes (Signed)
Discussed post op day goals with patient including ambulation, IS, diet progression, pain, and nausea control.  BSTOP education provided including BSTOP information guide, "Guide for Pain Management after your Bariatric Procedure".  Questions answered. 

## 2018-12-24 NOTE — Anesthesia Procedure Notes (Signed)
Procedure Name: Intubation Date/Time: 12/24/2018 7:42 AM Performed by: Sharlette Dense, CRNA Patient Re-evaluated:Patient Re-evaluated prior to induction Oxygen Delivery Method: Circle system utilized Preoxygenation: Pre-oxygenation with 100% oxygen Induction Type: IV induction Ventilation: Mask ventilation without difficulty and Oral airway inserted - appropriate to patient size Laryngoscope Size: Sabra Heck and 2 Grade View: Grade I Tube type: Oral Tube size: 7.5 mm Number of attempts: 1 Airway Equipment and Method: Stylet Placement Confirmation: ETT inserted through vocal cords under direct vision,  positive ETCO2 and breath sounds checked- equal and bilateral Secured at: 21 cm Tube secured with: Tape Dental Injury: Teeth and Oropharynx as per pre-operative assessment

## 2018-12-25 ENCOUNTER — Encounter (HOSPITAL_COMMUNITY): Payer: Self-pay | Admitting: General Surgery

## 2018-12-25 LAB — COMPREHENSIVE METABOLIC PANEL
ALT: 34 U/L (ref 0–44)
AST: 29 U/L (ref 15–41)
Albumin: 3.8 g/dL (ref 3.5–5.0)
Alkaline Phosphatase: 65 U/L (ref 38–126)
Anion gap: 10 (ref 5–15)
BUN: 14 mg/dL (ref 6–20)
CO2: 23 mmol/L (ref 22–32)
Calcium: 8.6 mg/dL — ABNORMAL LOW (ref 8.9–10.3)
Chloride: 102 mmol/L (ref 98–111)
Creatinine, Ser: 0.65 mg/dL (ref 0.44–1.00)
GFR calc Af Amer: >60 mL/min (ref >60)
GFR calc non Af Amer: >60 mL/min (ref >60)
Glucose, Bld: 129 mg/dL — ABNORMAL HIGH (ref 70–99)
Potassium: 3.9 mmol/L (ref 3.5–5.1)
Sodium: 135 mmol/L (ref 135–145)
Total Bilirubin: 0.6 mg/dL (ref 0.3–1.2)
Total Protein: 7.1 g/dL (ref 6.5–8.1)

## 2018-12-25 LAB — CBC WITH DIFFERENTIAL/PLATELET
Abs Immature Granulocytes: 0.09 10*3/uL — ABNORMAL HIGH (ref 0.00–0.07)
Basophils Absolute: 0 10*3/uL (ref 0.0–0.1)
Basophils Relative: 0 %
Eosinophils Absolute: 0 10*3/uL (ref 0.0–0.5)
Eosinophils Relative: 0 %
HCT: 32.6 % — ABNORMAL LOW (ref 36.0–46.0)
Hemoglobin: 10.4 g/dL — ABNORMAL LOW (ref 12.0–15.0)
Immature Granulocytes: 1 %
Lymphocytes Relative: 12 %
Lymphs Abs: 1.9 10*3/uL (ref 0.7–4.0)
MCH: 27.6 pg (ref 26.0–34.0)
MCHC: 31.9 g/dL (ref 30.0–36.0)
MCV: 86.5 fL (ref 80.0–100.0)
Monocytes Absolute: 0.7 10*3/uL (ref 0.1–1.0)
Monocytes Relative: 4 %
Neutro Abs: 13.3 10*3/uL — ABNORMAL HIGH (ref 1.7–7.7)
Neutrophils Relative %: 83 %
Platelets: 376 10*3/uL (ref 150–400)
RBC: 3.77 MIL/uL — ABNORMAL LOW (ref 3.87–5.11)
RDW: 16.5 % — ABNORMAL HIGH (ref 11.5–15.5)
WBC: 16 10*3/uL — ABNORMAL HIGH (ref 4.0–10.5)
nRBC: 0 % (ref 0.0–0.2)

## 2018-12-25 LAB — GLUCOSE, CAPILLARY
Glucose-Capillary: 103 mg/dL — ABNORMAL HIGH (ref 70–99)
Glucose-Capillary: 114 mg/dL — ABNORMAL HIGH (ref 70–99)
Glucose-Capillary: 127 mg/dL — ABNORMAL HIGH (ref 70–99)

## 2018-12-25 LAB — SURGICAL PATHOLOGY

## 2018-12-25 MED ORDER — GABAPENTIN 100 MG PO CAPS: 200.0000 mg | ORAL_CAPSULE | Freq: Two times a day (BID) | ORAL | 0 refills | Status: DC

## 2018-12-25 MED ORDER — ONDANSETRON 4 MG PO TBDP: 4.0000 mg | ORAL_TABLET | Freq: Four times a day (QID) | ORAL | 0 refills | Status: DC | PRN

## 2018-12-25 MED ORDER — PANTOPRAZOLE SODIUM 40 MG PO TBEC: 40.0000 mg | DELAYED_RELEASE_TABLET | Freq: Every day | ORAL | 0 refills | Status: DC

## 2018-12-25 MED ORDER — TRAMADOL HCL 50 MG PO TABS: 50.0000 mg | ORAL_TABLET | Freq: Four times a day (QID) | ORAL | 0 refills | Status: DC | PRN

## 2018-12-25 MED ORDER — ACETAMINOPHEN 500 MG PO TABS: 1000.0000 mg | ORAL_TABLET | Freq: Three times a day (TID) | ORAL | 0 refills | Status: AC

## 2018-12-25 NOTE — Progress Notes (Signed)
Patient alert and oriented, pain is controlled. Patient is tolerating fluids, advanced to protein shake today, patient is tolerating well.  Reviewed Gastric sleeve discharge instructions with patient and patient is able to articulate understanding.  Provided information on BELT program, Support Group and [facility] outpatient pharmacy. All questions answered, will continue to monitor.  Total fluid intake 780 Per dehydration protocol call back one week post op 

## 2018-12-25 NOTE — Discharge Instructions (Signed)
Monitor your blood pressure daily. If the systolic number (top number) is running in the low 100s or lower, stop HTCZ      GASTRIC BYPASS/SLEEVE  Home Care Instructions   These instructions are to help you care for yourself when you go home.  Call: If you have any problems.  Call [phone] and ask for the surgeon on call  If you need immediate help, come to the ER at [facility].   Tell the ER staff that you are a new post-op gastric bypass or gastric sleeve patient   Signs and symptoms to report:  Severe vomiting or nausea o If you cannot keep down clear liquids for longer than 1 day, call your surgeon   Abdominal pain that does not get better after taking your pain medication  Fever over 100.4 F with chills  Heart beating over 100 beats a minute  Shortness of breath at rest  Chest pain   Redness, swelling, drainage, or foul odor at incision (surgical) sites   If your incisions open or pull apart  Swelling or pain in calf (lower leg)  Diarrhea (Loose bowel movements that happen often), frequent watery, uncontrolled bowel movements  Constipation, (no bowel movements for 3 days) if this happens: Pick one o Milk of Magnesia, 2 tablespoons by mouth, 3 times a day for 2 days if needed o Stop taking Milk of Magnesia once you have a bowel movement o Call your doctor if constipation continues Or o Miralax  (instead of Milk of Magnesia) following the label instructions o Stop taking Miralax once you have a bowel movement o Call your doctor if constipation continues  Anything you think is not normal   Normal side effects after surgery:  Unable to sleep at night or unable to focus  Irritability or moody  Being tearful (crying) or depressed These are common complaints, possibly related to your anesthesia medications that put you to sleep, stress of surgery, and change in lifestyle.  This usually goes away a few weeks after surgery.  If these feelings continue, call  your primary care doctor.   Wound Care: You may have surgical glue, steri-strips, or staples over your incisions after surgery  Surgical glue:  Looks like a clear film over your incisions and will wear off a little at a time  Steri-strips: Strips of tape over your incisions. You may notice a yellowish color on the skin under the steri-strips. This is used to make the   steri-strips stick better. Do not pull the steri-strips off - let them fall off  Staples: Staples may be removed before you leave the hospital o If you go home with staples, call [facility], [phone]) [phone] at for an appointment with your surgeons nurse to have staples removed 10 days after surgery.  Showering: You may shower two (2) days after your surgery unless your surgeon tells you differently o Wash gently around incisions with warm soapy water, rinse well, and gently pat dry  o No tub baths until staples are removed, steri-strips fall off or glue is gone.    Medications:  Medications should be liquid or crushed if larger than the size of a dime  Extended release pills (medication that release a little bit at a time through the day) should NOT be crushed or cut. (examples include XL, ER, DR, SR)  Depending on the size and number of medications you take, you may need to space (take a few throughout the day)/change the time you take  your medications so that you do not over-fill your pouch (smaller stomach)  Make sure you follow-up with your primary care doctor to make medication changes needed during rapid weight loss and life-style changes  If you have diabetes, follow up with the doctor that orders your diabetes medication(s) within one week after surgery and check your blood sugar regularly.  Do not drive while taking prescription pain medication   It is ok to take Tylenol by the bottle instructions with your pain medicine or instead of your pain medicine as needed.  DO NOT TAKE NSAIDS (EXAMPLES OF  NSAIDS:  IBUPROFREN/ NAPROXEN)  Diet:                    First 2 Weeks  You will see the dietician t about two (2) weeks after your surgery. The dietician will increase the types of foods you can eat if you are handling liquids well:  If you have severe vomiting or nausea and cannot keep down clear liquids lasting longer than 1 day, call your surgeon @ [phone]) Protein Shake  Drink at least 2 ounces of shake 5-6 times per day  Each serving of protein shakes (usually 8 - 12 ounces) should have: o 15 grams of protein  o And no more than 5 grams of carbohydrate   Goal for protein each day: o Men = 80 grams per day o Women = 60 grams per day  Protein powder may be added to fluids such as non-fat milk or Lactaid milk or unsweetened Soy/Almond milk (limit to 35 grams added protein powder per serving)  Hydration  Slowly increase the amount of water and other clear liquids as tolerated (See Acceptable Fluids)  Slowly increase the amount of protein shake as tolerated    Sip fluids slowly and throughout the day.  Do not use straws.  May use sugar substitutes in small amounts (no more than 6 - 8 packets per day; i.e. Splenda)  Fluid Goal  The first goal is to drink at least 8 ounces of protein shake/drink per day (or as directed by the nutritionist); some examples of protein shakes are [employer], [employer]R [employer], EAS Edge HP, and Unjury. See handout from pre-op Bariatric Education Class: o Slowly increase the amount of protein shake you drink as tolerated o You may find it easier to slowly sip shakes throughout the day o It is important to get your proteins in first  Your fluid goal is to drink 64 - 100 ounces of fluid daily o It may take a few weeks to build up to this  32 oz (or more) should be clear liquids  And   32 oz (or more) should be full liquids (see below for examples)  Liquids should not contain sugar, caffeine, or carbonation  Clear Liquids:  Water or  Sugar-free flavored water (i.e. Fruit H2O, Propel)  Decaffeinated coffee or tea (sugar-free)  Crystal Lite, Wylers Lite, Minute Maid Lite  Sugar-free Jell-O  Bouillon or broth  Sugar-free Popsicle:   *Less than 20 calories each; Limit 1 per day  Full Liquids: Protein Shakes/Drinks + 2 choices per day of other full liquids  Full liquids must be: o No More Than 15 grams of Carbs per serving  o No More Than 3 grams of Fat per serving  Strained low-fat cream soup (except Cream of Potato or Tomato)  Non-Fat milk  Fat-free Lactaid Milk  Unsweetened Soy Or Unsweetened Almond Milk  Low Sugar yogurt (Dannon Lite &  Fit, [place] yogurt; Oikos Triple Zero; Chobani Simply 100; Yoplait 100 calorie [place] - No Fruit on the Bottom)    Vitamins and Minerals  Start 1 day after surgery unless otherwise directed by your surgeon  2 Chewable Bariatric Specific Multivitamin / Multimineral Supplement with iron (Example: Bariatric Advantage Multi EA)  Chewable Calcium with Vitamin D-3 (Example: 3 Chewable Calcium Plus 600 with Vitamin D-3) o Take 500 mg three (3) times a day for a total of 1500 mg each day o Do not take all 3 doses of calcium at one time as it may cause constipation, and you can only absorb 500 mg  at a time  o Do not mix multivitamins containing iron with calcium supplements; take 2 hours apart  Menstruating women and those with a history of anemia (a blood disease that causes weakness) may need extra iron o Talk with your doctor to see if you need more iron  Do not stop taking or change any vitamins or minerals until you talk to your dietitian or surgeon  Your Dietitian and/or surgeon must approve all vitamin and mineral supplements   Activity and Exercise: Limit your physical activity as instructed by your doctor.  It is important to continue walking at home.  During this time, use these guidelines:  Do not lift anything greater than ten (10) pounds for at least two (2)  weeks  Do not go back to work or drive until Engineer, production says you can  You may have sex when you feel comfortable  o It is VERY important for female patients to use a reliable birth control method; fertility often increases after surgery  o All hormonal birth control will be ineffective for 30 days after surgery due to medications given during surgery a barrier method must be used. o Do not get pregnant for at least 18 months  Start exercising as soon as your doctor tells you that you can o Make sure your doctor approves any physical activity  Start with a simple walking program  Walk 5-15 minutes each day, 7 days per week.   Slowly increase until you are walking 30-45 minutes per day Consider joining our [place] program. [phone] or email [email or web address]@[employer] .edu   Special Instructions Things to remember:  Use your CPAP when sleeping if this applies to you   [facility] has two free Bariatric Surgery Support Groups that meet monthly o The 3rd Thursday of each month, 6 pm, [facility]  o The 2nd Friday of each month, 11:45 am in the private dining room in the basement of [place]  It is very important to keep all follow up appointments with your surgeon, dietitian, primary care physician, and behavioral health practitioner  Routine follow up schedule with your surgeon include appointments at 2-3 weeks, 6-8 weeks, 6 months, and 1 year at a minimum.  Your surgeon may request to see you more often.   o After the first year, please follow up with your bariatric surgeon and dietitian at least once a year in order to maintain best weight loss results [facility]: [place]: [phone] Bariatric Nurse Coordinator: [phone]      Reviewed and Endorsed  by [facility] Patient Education Committee, June, 2016 Edits Approved: Aug, 2018

## 2018-12-25 NOTE — Progress Notes (Signed)
Nutrition Brief Note  RD consulted for diet education for patient s/p bariatric surgery. At this time, Bariatric nurse coordinator providing education.   If nutrition issues arise, please consult RD.   Clayton Bibles, MS, RD, LDN Inpatient Clinical Dietitian Pager: [phone] After Hours Pager: [phone]

## 2018-12-25 NOTE — Plan of Care (Signed)
Pt was discharged home today. Instructions were reviewed with patient, and questions were answered. Pt was taken to main entrance via wheelchair by NT.  

## 2018-12-26 NOTE — Discharge Summary (Signed)
Physician Discharge Summary  [name] HTD:[id number] DOB: 24-Jul-1965 DOA: 12/24/2018  PCP: Maurice Small, MD  Admit date: 12/24/2018 Discharge date: 12/25/2018  Recommendations for Outpatient Follow-up:   Follow-up Information    Greer Pickerel, MD. Go on 02/12/2019.   Specialty: General Surgery Why: at [address] Contact information: [address] [place] [address] [phone]        Carlena Hurl, PA-C. Go on 01/13/2019.   Specialty: General Surgery Why: at [address] Contact information: [place] [address] [phone]          Discharge Diagnoses:  Principal Problem:   Obesity (BMI 30-39.9) Active Problems:   Lumbosacral pain, chronic   Essential hypertension   Diabetes mellitus type 2 in obese (HCC)   Hypercholesteremia   S/P laparoscopic sleeve gastrectomy   Surgical Procedure: Laparoscopic Sleeve Gastrectomy, upper endoscopy  Discharge Condition: Good Disposition: Home  Diet recommendation: Postoperative sleeve gastrectomy diet (liquids only)  Filed Weights   12/24/18 0520  Weight: 97 kg     Hospital Course:  The patient was admitted for a planned laparoscopic sleeve gastrectomy. Please see operative note. Preoperatively the patient was given 5000 units of subcutaneous heparin for DVT prophylaxis. Postoperative prophylactic Lovenox dosing was started on the evening of postoperative day 0. ERAS protocol was used. On the evening of postoperative day 0, the patient was started on water and ice chips. On postoperative day 1 the patient had no fever or tachycardia and was tolerating water in their diet was gradually advanced throughout the day. The patient was ambulating without difficulty. Their vital signs are stable without fever or tachycardia. Their hemoglobin had remained stable. She had some epigastric discomfort which was not uncommon.  The patient was on chronic pain medication regimen. A similar regimen was used while in  hospital with addition of toradol and gabapenting.  The patient had received discharge instructions and counseling. They were deemed stable for discharge and had met discharge criteria. Her pain mgmt doctor had requested that we not prescribed any addl pain medication on discharge.   BP (!) 152/85 (BP Location: Right Arm)   Pulse 90   Temp 98.6 F (37 C)   Resp 17   Ht _0  (1.626 m)   Wt 97 kg   SpO2 95%   BMI 36.70 kg/m   Gen: alert, NAD, non-toxic appearing Pupils: equal, no scleral icterus Pulm: Lungs clear to auscultation, symmetric chest rise CV: regular rate and rhythm Abd: soft, mild approp tender, nondistended.  No cellulitis. No incisional hernia Ext: no edema, no calf tenderness Skin: no rash, no jaundice    Discharge Instructions  Discharge Instructions    Ambulate hourly while awake   Complete by: As directed    Call MD for:  difficulty breathing, headache or visual disturbances   Complete by: As directed    Call MD for:  persistant dizziness or light-headedness   Complete by: As directed    Call MD for:  persistant nausea and vomiting   Complete by: As directed    Call MD for:  redness, tenderness, or signs of infection (pain, swelling, redness, odor or green/yellow discharge around incision site)   Complete by: As directed    Call MD for:  severe uncontrolled pain   Complete by: As directed    Call MD for:  temperature >101 F   Complete by: As directed    Diet bariatric full liquid   Complete by: As directed  Discharge instructions   Complete by: As directed    See bariatric discharge instructions   Incentive spirometry   Complete by: As directed    Perform hourly while awake     Allergies as of 12/25/2018   No Known Allergies     Medication List    STOP taking these medications   ibuprofen 200 MG tablet Commonly known as: ADVIL   meclizine 25 MG tablet Commonly known as: ANTIVERT   metFORMIN 1000 MG tablet Commonly known as:  GLUCOPHAGE     TAKE these medications   acetaminophen 500 MG tablet Commonly known as: TYLENOL Take 2 tablets (1,000 mg total) by mouth every 8 (eight) hours for 5 days.   amLODipine 5 MG tablet Commonly known as: NORVASC Take 5 mg by mouth daily. Notes to patient: Monitor Blood Pressure Daily and keep a log for primary care physician.  You may need to make changes to your medications with rapid weight loss.     CALCIUM PO Take 1 tablet by mouth daily.   FLUoxetine 40 MG capsule Commonly known as: PROZAC Take 40 mg by mouth daily.   gabapentin 100 MG capsule Commonly known as: NEURONTIN Take 2 capsules (200 mg total) by mouth every 12 (twelve) hours.   hydrochlorothiazide 12.5 MG tablet Commonly known as: HYDRODIURIL Take 12.5 mg by mouth daily. Notes to patient: Monitor Blood Pressure Daily and keep a log for primary care physician.  Monitor for symptoms of dehydration.  You may need to make changes to your medications with rapid weight loss.     levonorgestrel 20 MCG/24HR IUD Commonly known as: MIRENA 1 each by Intrauterine route once. Inserted 2017   multivitamin with minerals Tabs tablet Take 1 tablet by mouth daily.   ondansetron 4 MG disintegrating tablet Commonly known as: ZOFRAN-ODT Take 1 tablet (4 mg total) by mouth every 6 (six) hours as needed for nausea or vomiting.   pantoprazole 40 MG tablet Commonly known as: PROTONIX Take 1 tablet (40 mg total) by mouth daily.   simvastatin 40 MG tablet Commonly known as: ZOCOR Take 40 mg by mouth every evening.   traMADol 50 MG tablet Commonly known as: ULTRAM Take 1 tablet (50 mg total) by mouth every 6 (six) hours as needed (pain).   triamcinolone cream 0.1 % Commonly known as: KENALOG Apply 1 application topically 2 (two) times daily as needed.   valsartan 160 MG tablet Commonly known as: DIOVAN Take 160 mg by mouth daily.      Follow-up Information    Greer Pickerel, MD. Go on 02/12/2019.   Specialty:  General Surgery Why: at [address] Contact information: [address] [place] [address] [phone]        Carlena Hurl, PA-C. Go on 01/13/2019.   Specialty: General Surgery Why: at [address] Contact information: [place] [address] [phone]            The results of significant diagnostics from this hospitalization (including imaging, microbiology, ancillary and laboratory) are listed below for reference.    Significant Diagnostic Studies: No results found.  Labs: Basic Metabolic Panel: Recent Labs  Lab 12/25/18 0307  NA 135  K 3.9  CL 102  CO2 23  GLUCOSE 129*  BUN 14  CREATININE 0.65  CALCIUM 8.6*   Liver Function Tests: Recent Labs  Lab 12/25/18 0307  AST 29  ALT 34  ALKPHOS 65  BILITOT 0.6  PROT 7.1  ALBUMIN 3.8  CBC: Recent Labs  Lab 12/24/18 1004 12/25/18 0307  WBC  --  16.0*  NEUTROABS  --  13.3*  HGB 11.2* 10.4*  HCT 35.2* 32.6*  MCV  --  86.5  PLT  --  376    CBG: Recent Labs  Lab 12/24/18 1603 12/24/18 1956 12/25/18 0000 12/25/18 0438 12/25/18 0716  GLUCAP 156* 132* 127* 114* 103*    Principal Problem:   Obesity (BMI 30-39.9) Active Problems:   Lumbosacral pain, chronic   Essential hypertension   Diabetes mellitus type 2 in obese ([place])   Hypercholesteremia   S/P laparoscopic sleeve gastrectomy   Time coordinating discharge: 15 min  Signed:  Gayland Curry, MD [facility], [place] [phone] 12/26/2018, 10:40 AM

## 2018-12-30 ENCOUNTER — Telehealth (HOSPITAL_COMMUNITY): Payer: Self-pay

## 2018-12-30 NOTE — Telephone Encounter (Signed)
Patient called to discuss post bariatric surgery follow up questions.  See below:   1.  Tell me about your pain and pain management?denies only tylenol  2.  Let's talk about fluid intake.  How much total fluid are you taking in?60  3.  How much protein have you taken in the last 2 days?60 grams  4.  Have you had nausea?  Tell me about when have experienced nausea and what you did to help?denies  5.  Has the frequency or color changed with your urine?light  6.  Tell me what your incisions look like?wnl  7.  Have you been passing gas? BM?yes several used miralax  8.  If a problem or question were to arise who would you call?  Do you know contact numbers for [facility], and [facility]?aware of services 9.  How has the walking going?walking regularly  10.  How are your vitamins and calcium going?  How are you taking them?mvi/ca without difficulty

## 2019-01-07 ENCOUNTER — Other Ambulatory Visit: Payer: Self-pay

## 2019-01-07 ENCOUNTER — Encounter: Payer: Self-pay | Admitting: Skilled Nursing Facility1

## 2019-01-07 ENCOUNTER — Encounter: Payer: Federal, State, Local not specified - PPO | Attending: General Surgery | Admitting: Skilled Nursing Facility1

## 2019-01-07 DIAGNOSIS — E669 Obesity, unspecified: Secondary | ICD-10-CM | POA: Insufficient documentation

## 2019-01-07 NOTE — Progress Notes (Signed)
2 Week Post-Operative Nutrition Class   Patient was seen on 03/19/18 for Post-Operative Nutrition education at the [facility].    Surgery date: 12/24/2018 Surgery type: Sleeve Start weight at [facility]:218.3 lbs Weight today: 200.6 lbs    Body Composition Scale Date Patient declined 01/07/19  Total Body Fat %   Visceral Fat   Fat-Free Mass %    Total Body Water %    Muscle-Mass lbs   Body Fat Displacement          Torso  lbs          Left Leg  lbs          Right Leg  lbs          Left Arm  lbs          Right Arm   lbs      The following the learning objectives were met by the patient during this course:  Identifies Phase 3 (Soft, High Proteins) Dietary Goals and will begin from 2 weeks post-operatively to 2 months post-operatively  Identifies appropriate sources of fluids and proteins   States protein recommendations and appropriate sources post-operatively  Identifies the need for appropriate texture modifications, mastication, and bite sizes when consuming solids  Identifies appropriate multivitamin and calcium sources post-operatively  Describes the need for physical activity post-operatively and will follow MD recommendations  States when to call healthcare provider regarding medication questions or post-operative complications   Handouts given during class include:  Phase 3A: Soft, High Protein Diet Handout   Follow-Up Plan: Patient will follow-up at [facility] in 6 weeks for 2 month post-op nutrition visit for diet advancement per MD.

## 2019-01-13 ENCOUNTER — Telehealth: Payer: Self-pay | Admitting: Skilled Nursing Facility1

## 2019-01-13 NOTE — Telephone Encounter (Signed)
RD called pt to verify fluid intake once starting soft, solid proteins 2 week post-bariatric surgery.   Daily Fluid intake: 64 oz or more  Daily Protein intake: 60g  +  Concerns/issues: Pt states it went well, no problems. Pt also states that she is logging her food to keep track of her protein intake.

## 2019-02-03 ENCOUNTER — Other Ambulatory Visit: Payer: Self-pay

## 2019-02-03 ENCOUNTER — Encounter: Payer: Federal, State, Local not specified - PPO | Attending: General Surgery | Admitting: Skilled Nursing Facility1

## 2019-02-03 DIAGNOSIS — E669 Obesity, unspecified: Secondary | ICD-10-CM

## 2019-02-03 NOTE — Progress Notes (Signed)
Bariatric Nutrition Follow-Up Visit Medical Nutrition Therapy   2 Months Post-Operative sleeve gastrecomy Surgery Surgery Date: 12/24/2018   NUTRITION ASSESSMENT    Anthropometrics  Start weight at [facility]: 218.3 lbs (date: 09/12/2017) Today's weight: 188.3 lbs Weight change:12 lbs (since previous nutrition visit)  Body Composition Scale Date  Weight  lbs 188.3  Total Body Fat  % 38.3     Visceral Fat 11  Fat-Free Mass  % 61.6     Total Body Water  % 45.3     Muscle-Mass  lbs 30.4  BMI 32.1  Body Fat Displacement ---        Torso  lbs 44.6        Left Leg  lbs 8.9        Right Leg  lbs 8.9        Left Arm  lbs 4.4        Right Arm  lbs 4.4   Clinical  Medical hx: diabetes, HTN Medications: no longer taking metformin  Labs:    Lifestyle & Dietary Hx  Pt is sailing right along with no issues reported.   Estimated daily fluid intake: 64 oz Estimated daily protein intake: 60+ g Supplements: celebrate and calcium  Current average weekly physical activity: resistance bands 3 times a week and walking 5 days a week 30 minutes   24-Hr Dietary Recall First Meal: 2 [place] sausage patties Snack: cheese  Second Meal: tuna packet Snack: Third Meal: shrimp or fish or chicken Snack:  Beverages: water, crystal light   Post-Op Goals/ Signs/ Symptoms Using straws: no Drinking while eating: no Chewing/swallowing difficulties: no Changes in vision: no Changes to mood/headaches: no Hair loss/changes to skin/nails: no Difficulty focusing/concentrating: no Sweating: no Dizziness/lightheadedness: no Palpitations: no  Carbonated/caffeinated beverages: no N/V/D/C/Gas: no Abdominal pain: no Dumping syndrome: no    NUTRITION DIAGNOSIS  Overweight/obesity ([place]-3.3) related to past poor dietary habits and physical inactivity as evidenced by completed bariatric surgery and following dietary guidelines for continued weight loss and healthy nutrition status.     NUTRITION  INTERVENTION Nutrition counseling (C-1) and education (E-2) to facilitate bariatric surgery goals, including: . Diet advancement to the next phase (phase 4) now including non starchy vegetables  . The importance of consuming adequate calories as well as certain nutrients daily due to the body's need for essential vitamins, minerals, and fats . The importance of daily physical activity and to reach a goal of at least 150 minutes of moderate to vigorous physical activity weekly (or as directed by their physician) due to benefits such as increased musculature and improved lab values  Goals: -Continue to aim for a minimum of 64 fluid ounces 7 days a week with at least 30 ounces being plain water -Eat non-starchy vegetables 2 times a day 7 days a week -Start out with soft cooked vegetables today and tomorrow; if tolerated begin to eat raw vegetables or cooked including salads -Eat your 3 ounces of protein first then start in on your non-starchy vegetables; once you understand how much of your meal leads to satisfaction and not full while still eating 3 ounces of protein and non-starchy vegetables you can eat them in any order  -Continue to aim for 30 minutes of activity at least 5 times a week -Do NOT cook with/add to your food: alfredo sauce, cheese sauce, barbeque sauce, ketchup, fat back, butter, bacon grease, grease, Crisco  -Try a capsule but have food on your stomach first   Handouts Provided Include  Non starchy vegetables   Learning Style & Readiness for Change Teaching method utilized: Visual & Auditory  Demonstrated degree of understanding via: Teach Back  Barriers to learning/adherence to lifestyle change: none identified   RD's Notes for Next Visit . Assess adherence to pt chosen goals . Add in fruit per pts request    MONITORING & EVALUATION Dietary intake, weekly physical activity, body weight  Next Steps Patient is to follow-up in 1 month

## 2019-02-03 NOTE — Patient Instructions (Signed)
-  Continue to aim for a minimum of 64 fluid ounces 7 days a week with at least 30 ounces being plain water  -Eat non-starchy vegetables 2 times a day 7 days a week  -Start out with soft cooked vegetables today and tomorrow; if tolerated begin to eat raw vegetables or cooked including salads  -Eat your 3 ounces of protein first then start in on your non-starchy vegetables; once you understand how much of your meal leads to satisfaction and not full while still eating 3 ounces of protein and non-starchy vegetables you can eat them in any order   -Continue to aim for 30 minutes of activity at least 5 times a week  -Do NOT cook with/add to your food: alfredo sauce, cheese sauce, barbeque sauce, ketchup, fat back, butter, bacon grease, grease, Crisco   -Try a capsule but have food on your stomach first

## 2019-02-17 ENCOUNTER — Ambulatory Visit: Payer: Federal, State, Local not specified - PPO | Admitting: Skilled Nursing Facility1

## 2019-03-05 DIAGNOSIS — E785 Hyperlipidemia, unspecified: Secondary | ICD-10-CM | POA: Diagnosis not present

## 2019-03-05 DIAGNOSIS — I10 Essential (primary) hypertension: Secondary | ICD-10-CM | POA: Diagnosis not present

## 2019-03-05 DIAGNOSIS — E119 Type 2 diabetes mellitus without complications: Secondary | ICD-10-CM | POA: Diagnosis not present

## 2019-03-05 DIAGNOSIS — Z0000 Encounter for general adult medical examination without abnormal findings: Secondary | ICD-10-CM | POA: Diagnosis not present

## 2019-03-06 DIAGNOSIS — Z1211 Encounter for screening for malignant neoplasm of colon: Secondary | ICD-10-CM | POA: Diagnosis not present

## 2019-03-06 DIAGNOSIS — E119 Type 2 diabetes mellitus without complications: Secondary | ICD-10-CM | POA: Diagnosis not present

## 2019-03-17 ENCOUNTER — Other Ambulatory Visit: Payer: Self-pay

## 2019-03-17 ENCOUNTER — Encounter: Payer: Federal, State, Local not specified - PPO | Attending: General Surgery | Admitting: Skilled Nursing Facility1

## 2019-03-17 DIAGNOSIS — E669 Obesity, unspecified: Secondary | ICD-10-CM | POA: Diagnosis not present

## 2019-03-17 DIAGNOSIS — E1169 Type 2 diabetes mellitus with other specified complication: Secondary | ICD-10-CM

## 2019-03-17 NOTE — Progress Notes (Signed)
Bariatric Nutrition Follow-Up Visit Medical Nutrition Therapy   Post-Operative sleeve gastrecomy Surgery Surgery Date: 12/24/2018   NUTRITION ASSESSMENT    Anthropometrics  Start weight at [facility]: 218.3 lbs (date: 09/12/2017) Today's weight: 178.5  Body Composition Scale Date 03/17/19  Weight  lbs 188.3 178.5  Total Body Fat  % 38.3 35.5     Visceral Fat 11 9  Fat-Free Mass  % 61.6 64.4     Total Body Water  % 45.3 46.7     Muscle-Mass  lbs 30.4 31.2  BMI 32.1 29.3  Body Fat Displacement ---         Torso  lbs 44.6 39.2        Left Leg  lbs 8.9 7.8        Right Leg  lbs 8.9 7.8        Left Arm  lbs 4.4 3.9        Right Arm  lbs 4.4 3.9   Clinical  Medical hx: diabetes, HTN Medications: no longer taking metformin  Labs:    Lifestyle & Dietary Hx  Pt is sailing right along with no issues reported.  Pt states her family telling her she has a lot energy.   Estimated daily fluid intake: 64 oz Estimated daily protein intake: 60+ g Supplements: celebrate and calcium  Current average weekly physical activity: resistance bands 3 times a week and walking 5 days a week 30 minutes   24-Hr Dietary Recall First Meal: 2 [place] sausage patties or 2 eggs Snack: cheese  Second Meal: tuna packet with broccoli or spinach or tomatoes and okra or grilled chicken salad  Snack: yogurt or 1 tablespoon peanut butter or protein shake Third Meal: tuna packet with broccoli or spinach or tomatoes and okra or grilled chicken salad  Snack:  Beverages: water, crystal light   Post-Op Goals/ Signs/ Symptoms Using straws: no Drinking while eating: no Chewing/swallowing difficulties: no Changes in vision: no Changes to mood/headaches: no Hair loss/changes to skin/nails: no Difficulty focusing/concentrating: no Sweating: no Dizziness/lightheadedness: no Palpitations: no  Carbonated/caffeinated beverages: no N/V/D/C/Gas: no Abdominal pain: no Dumping syndrome: no    NUTRITION DIAGNOSIS   Overweight/obesity ([place]-3.3) related to past poor dietary habits and physical inactivity as evidenced by completed bariatric surgery and following dietary guidelines for continued weight loss and healthy nutrition status.     NUTRITION INTERVENTION Nutrition counseling (C-1) and education (E-2) to facilitate bariatric surgery goals, including: . Diet advancement to the next phase (phase 4) now including starchy vegetables/fruit . The importance of consuming adequate calories as well as certain nutrients daily due to the body's need for essential vitamins, minerals, and fats . The importance of daily physical activity and to reach a goal of at least 150 minutes of moderate to vigorous physical activity weekly (or as directed by their physician) due to benefits such as increased musculature and improved lab values . Why you need complex carbohydrates: Whole grains and other complex carbohydrates are required to have a healthy diet. Whole grains provide fiber which can help with blood glucose levels and help keep you satiated. Fruits and starchy vegetables provide essential vitamins and minerals required for immune function, eyesight support, brain support, bone density, wound healing and many other functions within the body. According to the current evidenced based 2015-2020 Dietary Guidelines for Americans, complex carbohydrates are part of a healthy eating pattern which is associated with a decreased risk for type 2 diabetes, cancers, and cardiovascular disease.   Goals: -add in complex  carbohydrates throughout the day ensuring you eat your protein and non starchy vegetables   Handouts Provided Include   starchy vegetables   Learning Style & Readiness for Change Teaching method utilized: Visual & Auditory  Demonstrated degree of understanding via: Teach Back  Barriers to learning/adherence to lifestyle change: none identified   RD's Notes for Next Visit . Assess adherence to pt chosen  goals   MONITORING & EVALUATION Dietary intake, weekly physical activity, body weight  Next Steps Patient is to follow-up in May

## 2019-05-26 ENCOUNTER — Other Ambulatory Visit: Payer: Self-pay

## 2019-05-26 ENCOUNTER — Encounter: Payer: Federal, State, Local not specified - PPO | Attending: General Surgery | Admitting: Skilled Nursing Facility1

## 2019-05-26 DIAGNOSIS — E669 Obesity, unspecified: Secondary | ICD-10-CM | POA: Insufficient documentation

## 2019-05-26 NOTE — Progress Notes (Signed)
Bariatric Nutrition Follow-Up Visit Medical Nutrition Therapy   Post-Operative sleeve gastrecomy Surgery Surgery Date: 12/24/2018   NUTRITION ASSESSMENT    Anthropometrics  Start weight at [facility]: 218.3 lbs (date: 09/12/2017) Today's weight: 172  Body Composition Scale Date 03/17/19 05/26/2019  Weight  lbs 188.3 178.5 172  Total Body Fat  % 38.3 35.5 34.3     Visceral Fat 11 9 9   Fat-Free Mass  % 61.6 64.4 65.6     Total Body Water  % 45.3 46.7 47.3     Muscle-Mass  lbs 30.4 31.2 31.1  BMI 32.1 29.3 28.3  Body Fat Displacement ---          Torso  lbs 44.6 39.2 36.4        Left Leg  lbs 8.9 7.8 7.2        Right Leg  lbs 8.9 7.8 7.2        Left Arm  lbs 4.4 3.9 3.6        Right Arm  lbs 4.4 3.9 3.6   Clinical  Medical hx: diabetes, HTN Medications: no longer taking metformin  Labs:    Lifestyle & Dietary Hx  Pt is sailing right along with no issues reported.  Pt states her family telling her she has a lot energy.   Pt states she is at an average of 1000 calories throughout the week.   Estimated daily fluid intake: 64 oz Estimated daily protein intake: 60+ g Supplements: celebrate and calcium  Current average weekly physical activity: resistance bands 3 times a week and walking 5 days a week 120 minutes   24-Hr Dietary Recall First Meal: 2 [place] sausage patties or 2 eggs or yogurt dannon light and fit or too good with fruit or peanut butter on sandwich thins with half banana or oatmeal  Snack: cheese  Second Meal: tuna packet with broccoli  on half sandwich thin or spinach or tomatoes and okra or grilled chicken salad with brown rice with mushrooms or edamame  Snack: sugar free jello or pudding or cheese stick  Third Meal: tuna packet with broccoli or spinach or tomatoes and okra or grilled chicken salad  Snack:  Beverages: water, crystal light   Post-Op Goals/ Signs/ Symptoms Using straws: no Drinking while eating: no Chewing/swallowing difficulties: no Changes  in vision: no Changes to mood/headaches: no Hair loss/changes to skin/nails: no Difficulty focusing/concentrating: no Sweating: no Dizziness/lightheadedness: no Palpitations: no  Carbonated/caffeinated beverages: no N/V/D/C/Gas: no Abdominal pain: no Dumping syndrome: no    NUTRITION DIAGNOSIS  Overweight/obesity ([place]-3.3) related to past poor dietary habits and physical inactivity as evidenced by completed bariatric surgery and following dietary guidelines for continued weight loss and healthy nutrition status.     NUTRITION INTERVENTION Nutrition counseling (C-1) and education (E-2) to facilitate bariatric surgery goals, including: . The importance of consuming adequate calories as well as certain nutrients daily due to the body's need for essential vitamins, minerals, and fats . The importance of daily physical activity and to reach a goal of at least 150 minutes of moderate to vigorous physical activity weekly (or as directed by their physician) due to benefits such as increased musculature and improved lab values . Why you need complex carbohydrates: Whole grains and other complex carbohydrates are required to have a healthy diet. Whole grains provide fiber which can help with blood glucose levels and help keep you satiated. Fruits and starchy vegetables provide essential vitamins and minerals required for immune function, eyesight support, brain support, bone  density, wound healing and many other functions within the body. According to the current evidenced based 2015-2020 Dietary Guidelines for Americans, complex carbohydrates are part of a healthy eating pattern which is associated with a decreased risk for type 2 diabetes, cancers, and cardiovascular disease.   Goals: -use actual oil not spray   Handouts Provided Include     Learning Style & Readiness for Change Teaching method utilized: Visual & Auditory  Demonstrated degree of understanding via: Teach Back  Barriers to  learning/adherence to lifestyle change: none identified   RD's Notes for Next Visit . Assess adherence to pt chosen goals   MONITORING & EVALUATION Dietary intake, weekly physical activity, body weight  Next Steps Patient is to follow-up in August

## 2019-06-02 DIAGNOSIS — E785 Hyperlipidemia, unspecified: Secondary | ICD-10-CM | POA: Diagnosis not present

## 2019-06-02 DIAGNOSIS — I10 Essential (primary) hypertension: Secondary | ICD-10-CM | POA: Diagnosis not present

## 2019-06-02 DIAGNOSIS — E119 Type 2 diabetes mellitus without complications: Secondary | ICD-10-CM | POA: Diagnosis not present

## 2019-06-11 DIAGNOSIS — E663 Overweight: Secondary | ICD-10-CM | POA: Diagnosis not present

## 2019-06-11 DIAGNOSIS — R5383 Other fatigue: Secondary | ICD-10-CM | POA: Diagnosis not present

## 2019-06-11 DIAGNOSIS — Z9884 Bariatric surgery status: Secondary | ICD-10-CM | POA: Diagnosis not present

## 2019-07-09 ENCOUNTER — Other Ambulatory Visit: Payer: Self-pay | Admitting: Radiology

## 2019-07-10 ENCOUNTER — Encounter: Payer: Self-pay | Admitting: *Deleted

## 2019-07-11 ENCOUNTER — Encounter: Payer: Self-pay | Admitting: *Deleted

## 2019-07-11 ENCOUNTER — Telehealth: Payer: Self-pay | Admitting: Hematology and Oncology

## 2019-07-11 DIAGNOSIS — D0512 Intraductal carcinoma in situ of left breast: Secondary | ICD-10-CM | POA: Insufficient documentation

## 2019-07-11 NOTE — Telephone Encounter (Signed)
Spoke to patient to confirm morning [facility] appointment for 6/23, packet sent by [facility]

## 2019-07-15 NOTE — Progress Notes (Signed)
[place] NOTE  Patient Care Team: Maurice Small, MD as PCP - General (Family Medicine) Mauro Kaufmann, RN as Oncology Nurse Navigator Rockwell Germany, RN as Oncology Nurse Navigator Stark Klein, MD as Consulting Physician (General Surgery) Nicholas Lose, MD as Consulting Physician (Hematology and Oncology) Kyung Rudd, MD as Consulting Physician (Radiation Oncology)  CHIEF COMPLAINTS/PURPOSE OF CONSULTATION:  Newly diagnosed breast cancer  HISTORY OF PRESENTING ILLNESS:  [name] 54 y.o. female is here because of recent diagnosis of ductal carcinoma in situ of the left breast. Screening mammogram showed indeterminate left breast calcifications. Mammogram showed a 2.0cm group of left breast calcifications at the 12:30 position. Biopsy on 07/09/19 showed ductal carcinoma in situ, grade 2, ER+ 95%, PR+ 20%. She presents to the clinic today for initial evaluation and discussion of treatment options.   I reviewed her records extensively and collaborated the history with the patient.  SUMMARY OF ONCOLOGIC HISTORY: Oncology History  Ductal carcinoma in situ (DCIS) of left breast  07/11/2019 Initial Diagnosis   Screening mammogram showed indeterminate left breast calcifications, 2.0cm, 12:30 position. Biopsy showed DCIS, grade 2, ER+ 95%, PR+ 20%.      MEDICAL HISTORY:  Past Medical History:  Diagnosis Date  . Complication of anesthesia 1997   facial swelling postop due to anesthesia per pt cyst removal from head  . Diabetes mellitus without complication ([place])    type 2  . High cholesterol   . Hypertension   . Lumbar disc disorder    lower lumbar    SURGICAL HISTORY: Past Surgical History:  Procedure Laterality Date  . BREAST SURGERY Bilateral 2019 may   reduction  . CHOLECYSTECTOMY    . CYST EXCISION  1997   from scalp  . DILATION AND CURETTAGE OF UTERUS    . hysterscopy  2014  . LAPAROSCOPIC GASTRIC SLEEVE RESECTION N/A 12/24/2018    Procedure: LAPAROSCOPIC GASTRIC SLEEVE RESECTION, Upper Endo, ERAS Pathway;  Surgeon: Greer Pickerel, MD;  Location: [facility];  Service: General;  Laterality: N/A;    SOCIAL HISTORY: Social History   Socioeconomic History  . Marital status: Divorced    Spouse name: Not on file  . Number of children: Not on file  . Years of education: Not on file  . Highest education level: Not on file  Occupational History  . Not on file  Tobacco Use  . Smoking status: Never Smoker  . Smokeless tobacco: Never Used  Vaping Use  . Vaping Use: Never used  Substance and Sexual Activity  . Alcohol use: Not Currently  . Drug use: No  . Sexual activity: Yes    Birth control/protection: I.U.D.  Other Topics Concern  . Not on file  Social History Narrative  . Not on file   Social Determinants of Health   Financial Resource Strain:   . Difficulty of Paying Living Expenses:   Food Insecurity:   . Worried About Charity fundraiser in the Last Year:   . Arboriculturist in the Last Year:   Transportation Needs:   . Film/video editor (Medical):   Marland Kitchen Lack of Transportation (Non-Medical):   Physical Activity:   . Days of Exercise per Week:   . Minutes of Exercise per Session:   Stress:   . Feeling of Stress :   Social Connections:   . Frequency of Communication with Friends and Family:   . Frequency of Social Gatherings with Friends and Family:   . Attends  Religious Services:   . Active Member of Clubs or Organizations:   . Attends Archivist Meetings:   Marland Kitchen Marital Status:   Intimate Partner Violence:   . Fear of Current or Ex-Partner:   . Emotionally Abused:   Marland Kitchen Physically Abused:   . Sexually Abused:     FAMILY HISTORY: Family History  Problem Relation Age of Onset  . Diabetes Mother   . Hypertension Mother   . Diabetes Father   . Hypertension Father     ALLERGIES:  has No Known Allergies.  MEDICATIONS:  Current Outpatient Medications  Medication Sig Dispense Refill    . amLODipine (NORVASC) 5 MG tablet Take 5 mg by mouth daily.    Marland Kitchen FLUoxetine (PROZAC) 40 MG capsule Take 40 mg by mouth daily.    . hydrochlorothiazide (HYDRODIURIL) 12.5 MG tablet Take 12.5 mg by mouth daily.    Marland Kitchen levonorgestrel (MIRENA) 20 MCG/24HR IUD 1 each by Intrauterine route once. Inserted 2017    . Multiple Vitamin (MULTIVITAMIN WITH MINERALS) TABS tablet Take 1 tablet by mouth daily.    Marland Kitchen triamcinolone cream (KENALOG) 0.1 % Apply 1 application topically 2 (two) times daily as needed.    Marland Kitchen CALCIUM PO Take 1 tablet by mouth daily.     No current facility-administered medications for this visit.    REVIEW OF SYSTEMS:   Constitutional: Denies fevers, chills or abnormal night sweats Eyes: Denies blurriness of vision, double vision or watery eyes Ears, nose, mouth, throat, and face: Denies mucositis or sore throat Respiratory: Denies cough, dyspnea or wheezes Cardiovascular: Denies palpitation, chest discomfort or lower extremity swelling Gastrointestinal:  Denies nausea, heartburn or change in bowel habits Skin: Denies abnormal skin rashes Lymphatics: Denies new lymphadenopathy or easy bruising Neurological:Denies numbness, tingling or new weaknesses Behavioral/Psych: Mood is stable, no new changes  Breast: Denies any palpable lumps or discharge All other systems were reviewed with the patient and are negative.  PHYSICAL EXAMINATION: ECOG PERFORMANCE STATUS: 1 - Symptomatic but completely ambulatory  Vitals:   07/16/19 0913  BP: 121/88  Pulse: 68  Resp: 17  Temp: 99.1 F (37.3 C)  SpO2: 100%   Filed Weights   07/16/19 0913  Weight: 174 lb 4.8 oz (79.1 kg)    GENERAL:alert, no distress and comfortable SKIN: skin color, texture, turgor are normal, no rashes or significant lesions EYES: normal, conjunctiva are pink and non-injected, sclera clear OROPHARYNX:no exudate, no erythema and lips, buccal mucosa, and tongue normal  NECK: supple, thyroid normal size,  non-tender, without nodularity LYMPH:  no palpable lymphadenopathy in the cervical, axillary or inguinal LUNGS: clear to auscultation and percussion with normal breathing effort HEART: regular rate & rhythm and no murmurs and no lower extremity edema ABDOMEN:abdomen soft, non-tender and normal bowel sounds Musculoskeletal:no cyanosis of digits and no clubbing  PSYCH: alert & oriented x 3 with fluent speech NEURO: no focal motor/sensory deficits    LABORATORY DATA:  I have reviewed the data as listed Lab Results  Component Value Date   WBC 5.8 07/16/2019   HGB 13.4 07/16/2019   HCT 40.0 07/16/2019   MCV 86.4 07/16/2019   PLT 368 07/16/2019   Lab Results  Component Value Date   NA 137 07/16/2019   K 4.6 07/16/2019   CL 106 07/16/2019   CO2 24 07/16/2019    RADIOGRAPHIC STUDIES: I have personally reviewed the radiological reports and agreed with the findings in the report.  ASSESSMENT AND PLAN:  Ductal carcinoma in situ (DCIS)  of left breast 07/11/2019:Screening mammogram showed indeterminate left breast calcifications, 2.0cm, 12:30 position. Biopsy showed DCIS with suspicion of microinvasion, grade 2, ER+ 95%, PR+ 20%.   Pathology review: I discussed with the patient the difference between DCIS and invasive breast cancer. It is considered a precancerous lesion. DCIS is classified as a 0. It is generally detected through mammograms as calcifications. We discussed the significance of grades and its impact on prognosis. We also discussed the importance of ER and PR receptors and their implications to adjuvant treatment options. Prognosis of DCIS dependence on grade, comedo necrosis. It is anticipated that if not treated, 20-30% of DCIS can develop into invasive breast cancer.  Recommendation: 1. Breast conserving surgery 2. Followed by adjuvant radiation therapy 3. Followed by antiestrogen therapy with tamoxifen 5 years  Tamoxifen counseling: We discussed the risks and benefits  of tamoxifen. These include but not limited to insomnia, hot flashes, mood changes, vaginal dryness, and weight gain. Although rare, serious side effects including endometrial cancer, risk of blood clots were also discussed. We strongly believe that the benefits far outweigh the risks. Patient understands these risks and consented to starting treatment. Planned treatment duration is 5 years.  Return to clinic after surgery to discuss the final pathology report and come up with an adjuvant treatment plan.      All questions were answered. The patient knows to call the clinic with any problems, questions or concerns.   Rulon Eisenmenger, MD, MPH 07/16/2019    I, Molly Dorshimer, am acting as scribe for Nicholas Lose, MD.  I have reviewed the above documentation for accuracy and completeness, and I agree with the above.

## 2019-07-16 ENCOUNTER — Encounter: Payer: Self-pay | Admitting: Licensed Clinical Social Worker

## 2019-07-16 ENCOUNTER — Encounter: Payer: Self-pay | Admitting: *Deleted

## 2019-07-16 ENCOUNTER — Other Ambulatory Visit: Payer: Self-pay | Admitting: General Surgery

## 2019-07-16 ENCOUNTER — Inpatient Hospital Stay: Payer: Federal, State, Local not specified - PPO

## 2019-07-16 ENCOUNTER — Inpatient Hospital Stay
Payer: Federal, State, Local not specified - PPO | Attending: Hematology and Oncology | Admitting: Hematology and Oncology

## 2019-07-16 ENCOUNTER — Ambulatory Visit
Admission: RE | Admit: 2019-07-16 | Discharge: 2019-07-16 | Disposition: A | Discharge status: Home / Self Care | Payer: Federal, State, Local not specified - PPO | Source: Ambulatory Visit | Attending: Radiation Oncology | Admitting: Radiation Oncology

## 2019-07-16 ENCOUNTER — Other Ambulatory Visit: Payer: Self-pay

## 2019-07-16 DIAGNOSIS — D0512 Intraductal carcinoma in situ of left breast: Secondary | ICD-10-CM | POA: Diagnosis not present

## 2019-07-16 DIAGNOSIS — Z79810 Long term (current) use of selective estrogen receptor modulators (SERMs): Secondary | ICD-10-CM | POA: Diagnosis not present

## 2019-07-16 DIAGNOSIS — Z170 Estrogen receptor positive status [ER+]: Secondary | ICD-10-CM | POA: Diagnosis not present

## 2019-07-16 DIAGNOSIS — C50412 Malignant neoplasm of upper-outer quadrant of left female breast: Secondary | ICD-10-CM | POA: Diagnosis not present

## 2019-07-16 LAB — CMP (CANCER CENTER ONLY)
ALT: 28 U/L (ref 0–44)
AST: 24 U/L (ref 15–41)
Albumin: 3.9 g/dL (ref 3.5–5.0)
Alkaline Phosphatase: 70 U/L (ref 38–126)
Anion gap: 7 (ref 5–15)
BUN: 17 mg/dL (ref 6–20)
CO2: 24 mmol/L (ref 22–32)
Calcium: 9.4 mg/dL (ref 8.9–10.3)
Chloride: 106 mmol/L (ref 98–111)
Creatinine: 0.86 mg/dL (ref 0.44–1.00)
GFR, Est AFR Am: >60 mL/min (ref >60)
GFR, Estimated: >60 mL/min (ref >60)
Glucose, Bld: 98 mg/dL (ref 70–99)
Potassium: 4.6 mmol/L (ref 3.5–5.1)
Sodium: 137 mmol/L (ref 135–145)
Total Bilirubin: 0.4 mg/dL (ref 0.3–1.2)
Total Protein: 7.4 g/dL (ref 6.5–8.1)

## 2019-07-16 LAB — CBC WITH DIFFERENTIAL (CANCER CENTER ONLY)
Abs Immature Granulocytes: 0 10*3/uL (ref 0.00–0.07)
Basophils Absolute: 0 10*3/uL (ref 0.0–0.1)
Basophils Relative: 1 %
Eosinophils Absolute: 0.2 10*3/uL (ref 0.0–0.5)
Eosinophils Relative: 4 %
HCT: 40 % (ref 36.0–46.0)
Hemoglobin: 13.4 g/dL (ref 12.0–15.0)
Immature Granulocytes: 0 %
Lymphocytes Relative: 39 %
Lymphs Abs: 2.3 10*3/uL (ref 0.7–4.0)
MCH: 28.9 pg (ref 26.0–34.0)
MCHC: 33.5 g/dL (ref 30.0–36.0)
MCV: 86.4 fL (ref 80.0–100.0)
Monocytes Absolute: 0.5 10*3/uL (ref 0.1–1.0)
Monocytes Relative: 9 %
Neutro Abs: 2.8 10*3/uL (ref 1.7–7.7)
Neutrophils Relative %: 47 %
Platelet Count: 368 10*3/uL (ref 150–400)
RBC: 4.63 MIL/uL (ref 3.87–5.11)
RDW: 13.5 % (ref 11.5–15.5)
WBC Count: 5.8 10*3/uL (ref 4.0–10.5)
nRBC: 0 % (ref 0.0–0.2)

## 2019-07-16 LAB — GENETIC SCREENING ORDER

## 2019-07-16 NOTE — Progress Notes (Signed)
Clinical Social Work [facility] Psychosocial Distress Screening [facility]   Patient completed distress screening protocol and scored a 10 on the Psychosocial Distress Thermometer which indicates severe distress. Clinical Social Worker met with patient in [facility] to assess for distress and other psychosocial needs.  Patient stated she was feeling overwhelmed but felt a little better after meeting with the treatment team and getting more information on her treatment plan.  Patient desribes herself as a private person, so it is hard for her to share about what is going on with her family and friends. Currently one friend and her boss know and have been supportive. Has not told her kids (ages 25 and 15) yet as she wanted more information first.   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 She walks for stress relief. She became teary discussing diagnosis and how it changes outlook today. CSW normalized emotions and will provide ongoing support for patient.  CSW and patient discussed common feeling and emotions when being diagnosed with cancer, and the importance of support during treatment.  CSW informed patient of the support team and support services at [facility].  CSW provided contact information and encouraged patient to call with any questions or concerns.    Distress Screen: ONCBCN DISTRESS SCREENING 07/16/2019  Screening Type Initial Screening  Distress experienced in past week (1-10) 10  Emotional problem type Nervousness/Anxiety;Adjusting to illness;Feeling hopeless  Information Concerns Type Lack of info about diagnosis;Lack of info about treatment;Lack of info about complementary therapy choices  Physical Problem type Sleep/insomnia     Michelle E Stoisits  Clinical Social Worker [facility]       

## 2019-07-16 NOTE — Assessment & Plan Note (Signed)
07/11/2019:Screening mammogram showed indeterminate left breast calcifications, 2.0cm, 12:30 position. Biopsy showed DCIS with suspicion of microinvasion, grade 2, ER+ 95%, PR+ 20%.   Pathology review: I discussed with the patient the difference between DCIS and invasive breast cancer. It is considered a precancerous lesion. DCIS is classified as a 0. It is generally detected through mammograms as calcifications. We discussed the significance of grades and its impact on prognosis. We also discussed the importance of ER and PR receptors and their implications to adjuvant treatment options. Prognosis of DCIS dependence on grade, comedo necrosis. It is anticipated that if not treated, 20-30% of DCIS can develop into invasive breast cancer.  Recommendation: 1. Breast conserving surgery 2. Followed by adjuvant radiation therapy 3. Followed by antiestrogen therapy with tamoxifen 5 years  Tamoxifen counseling: We discussed the risks and benefits of tamoxifen. These include but not limited to insomnia, hot flashes, mood changes, vaginal dryness, and weight gain. Although rare, serious side effects including endometrial cancer, risk of blood clots were also discussed. We strongly believe that the benefits far outweigh the risks. Patient understands these risks and consented to starting treatment. Planned treatment duration is 5 years.  Return to clinic after surgery to discuss the final pathology report and come up with an adjuvant treatment plan.

## 2019-07-16 NOTE — Progress Notes (Signed)
Radiation Oncology         [phone] ________________________________  Name: [name]        MRN: [id number]  Date of Service: 07/16/2019 DOB: 01-08-1966  HQ:IONG, Arbie Cookey, MD  Stark Klein, MD     REFERRING PHYSICIAN: Stark Klein, MD   DIAGNOSIS: The encounter diagnosis was Ductal carcinoma in situ (DCIS) of left breast.   HISTORY OF PRESENT ILLNESS: [name] is a 54 y.o. female seen in the multidisciplinary breast clinic for a new diagnosis of left breast cancer. The patient was noted to have screening detected calcifications in the left breast and diagnostic imaging was performed which revealed a group of calcifications in the 12:30 position of the left breast. A biopsy of the area on 07/09/19 revealed intermediate grade DCIS ith necrosis and calcifications. Her DCIS could not exclude microinvasion. Her tumor was ER/PR positive. She's seen today to discuss options of treatment of her cancer.   PREVIOUS RADIATION THERAPY: No   PAST MEDICAL HISTORY:  Past Medical History:  Diagnosis Date  . Complication of anesthesia 1997   facial swelling postop due to anesthesia per pt cyst removal from head  . Diabetes mellitus without complication ([place])    type 2  . High cholesterol   . Hypertension   . Lumbar disc disorder    lower lumbar       PAST SURGICAL HISTORY: Past Surgical History:  Procedure Laterality Date  . BREAST SURGERY Bilateral 2019 may   reduction  . CHOLECYSTECTOMY    . CYST EXCISION  1997   from scalp  . DILATION AND CURETTAGE OF UTERUS    . hysterscopy  2014  . LAPAROSCOPIC GASTRIC SLEEVE RESECTION N/A 12/24/2018   Procedure: LAPAROSCOPIC GASTRIC SLEEVE RESECTION, Upper Endo, ERAS Pathway;  Surgeon: Greer Pickerel, MD;  Location: [facility];  Service: General;  Laterality: N/A;     FAMILY HISTORY:  Family History  Problem Relation Age of Onset  . Diabetes Mother   . Hypertension Mother   . Diabetes Father   . Hypertension Father       SOCIAL HISTORY:  reports that she has never smoked. She has never used smokeless tobacco. She reports previous alcohol use. She reports that she does not use drugs. The patient is divorced and lives in [place]. She works for the post [employer]. She is accompanied by her friend [place].   ALLERGIES: Patient has no known allergies.   MEDICATIONS:  Current Outpatient Medications  Medication Sig Dispense Refill  . amLODipine (NORVASC) 5 MG tablet Take 5 mg by mouth daily.    Marland Kitchen CALCIUM PO Take 1 tablet by mouth daily.    Marland Kitchen FLUoxetine (PROZAC) 40 MG capsule Take 40 mg by mouth daily.    . hydrochlorothiazide (HYDRODIURIL) 12.5 MG tablet Take 12.5 mg by mouth daily.    Marland Kitchen levonorgestrel (MIRENA) 20 MCG/24HR IUD 1 each by Intrauterine route once. Inserted 2017    . Multiple Vitamin (MULTIVITAMIN WITH MINERALS) TABS tablet Take 1 tablet by mouth daily.    Marland Kitchen triamcinolone cream (KENALOG) 0.1 % Apply 1 application topically 2 (two) times daily as needed.     No current facility-administered medications for this encounter.     REVIEW OF SYSTEMS: On review of systems, the patient reports that she is doing well overall. She is understandably very concerned and anxious about her diagnosis. No other complaints are noted.     PHYSICAL EXAM:  Wt Readings from Last 3 Encounters:  07/16/19 174  lb 4.8 oz (79.1 kg)  05/26/19 172 lb (78 kg)  03/17/19 178 lb 8 oz (81 kg)   Temp Readings from Last 3 Encounters:  07/16/19 99.1 F (37.3 C) (Temporal)  12/25/18 98.6 F (37 C)  12/18/18 99.4 F (37.4 C)   BP Readings from Last 3 Encounters:  07/16/19 121/88  12/25/18 (!) 152/85  09/18/18 130/72   Pulse Readings from Last 3 Encounters:  07/16/19 68  12/25/18 90  12/18/18 80    In general this is a well appearing African American female in no acute distress. She's alert and oriented x4 and appropriate throughout the examination. Cardiopulmonary assessment is negative for acute distress and  she exhibits normal effort. Bilateral breast exam is deferred.    ECOG = 0  0 - Asymptomatic (Fully active, able to carry on all predisease activities without restriction)  1 - Symptomatic but completely ambulatory (Restricted in physically strenuous activity but ambulatory and able to carry out work of a light or sedentary nature. For example, light housework, office work)  2 - Symptomatic, <50% in bed during the day (Ambulatory and capable of all self care but unable to carry out any work activities. Up and about more than 50% of waking hours)  3 - Symptomatic, >50% in bed, but not bedbound (Capable of only limited self-care, confined to bed or chair 50% or more of waking hours)  4 - Bedbound (Completely disabled. Cannot carry on any self-care. Totally confined to bed or chair)  5 - Death   Eustace Pen MM, Creech RH, Tormey DC, et al. [id number]). "Toxicity and response criteria of the [facility]". [place] Oncol. 5 (6): 649-55    LABORATORY DATA:  Lab Results  Component Value Date   WBC 5.8 07/16/2019   HGB 13.4 07/16/2019   HCT 40.0 07/16/2019   MCV 86.4 07/16/2019   PLT 368 07/16/2019   Lab Results  Component Value Date   NA 137 07/16/2019   K 4.6 07/16/2019   CL 106 07/16/2019   CO2 24 07/16/2019   Lab Results  Component Value Date   ALT 28 07/16/2019   AST 24 07/16/2019   ALKPHOS 70 07/16/2019   BILITOT 0.4 07/16/2019      RADIOGRAPHY: No results found.     IMPRESSION/PLAN: 1. Intermediate grade, ER/PR positive invasive ductal carcinoma of the left breast. Dr. Lisbeth Renshaw discusses the pathology findings and reviews the nature of noninvasive left breast disease. The consensus from the breast conference includes breast conservation with lumpectomy with  sentinel node biopsy given her possible microinvasive component. We discussed the rationale to consider external radiotherapy to the breast followed by antiestrogen therapy both in the adjuvant  setting. We discussed the risks, benefits, short, and long term effects of radiotherapy, and the patient is interested in proceeding. Dr. Lisbeth Renshaw discusses the delivery and logistics of radiotherapy and anticipates a course of  6 1/2 weeks of radiotherapy. We would plan to see her back 2-3 weeks postoperatively and coordinate simulation to begin therapy about 4-6 weeks following her lumpectomy.   In a visit lasting 60 minutes, greater than 50% of the time was spent face to face reviewing her case, as well as in preparation of, discussing, and coordinating the patient's care.  The above documentation reflects my direct findings during this shared patient visit. Please see the separate note by Dr. Lisbeth Renshaw on this date for the remainder of the patient's plan of care.    Carola Rhine, PAC

## 2019-07-17 ENCOUNTER — Telehealth: Payer: Self-pay | Admitting: Hematology and Oncology

## 2019-07-17 NOTE — Telephone Encounter (Signed)
No 6/23 los, no changes made to patient schedule

## 2019-07-21 ENCOUNTER — Telehealth: Payer: Self-pay | Admitting: Hematology and Oncology

## 2019-07-21 NOTE — Telephone Encounter (Signed)
Scheduled per 6/25. Pt is aware of appts.

## 2019-07-22 ENCOUNTER — Other Ambulatory Visit: Payer: Self-pay | Admitting: *Deleted

## 2019-07-22 DIAGNOSIS — D0512 Intraductal carcinoma in situ of left breast: Secondary | ICD-10-CM

## 2019-07-23 NOTE — Pre-Procedure Instructions (Signed)
[id number] [place], [place] [address] [place] [address] Phone: [phone] Fax: [phone]      Your procedure is scheduled on Friday July 9th.  Report to [facility] Main Entrance "A" at 5:30 A.M., and check in at the Admitting office.  Call this number if you have problems the morning of surgery:  [phone]  Call [phone] if you have any questions prior to your surgery date Monday-Friday 8am-4pm    Remember:  Do not eat or drink after midnight the night before your surgery  You may drink clear liquids until 4:30am the morning of your surgery.   Clear liquids allowed are: Water, Non-Citrus Juices (without pulp), Carbonated Beverages, Clear Tea, Black Coffee Only, and Gatorade    Enhanced Recovery after Surgery for Orthopedics Enhanced Recovery after Surgery is a protocol used to improve the stress on your body and your recovery after surgery.  Patient Instructions  . The night before surgery:  o No food after midnight. ONLY clear liquids after midnight  .  Marland Kitchen The day of surgery (if you do NOT have diabetes):  o Drink ONE (1) Pre-Surgery Clear Ensure as directed.   o This drink was given to you during your hospital  pre-op appointment visit. o The pre-op nurse will instruct you on the time to drink the  Pre-Surgery Ensure depending on your surgery time. o Finish the drink at the designated time by the pre-op nurse.  o Nothing else to drink after completing the  Pre-Surgery Clear Ensure.         If you have questions, please contact your surgeon's office.   Take these medicines the morning of surgery with A SIP OF WATER   amLODipine (NORVASC) 5 MG tablet  FLUoxetine (PROZAC) 40 MG capsule   As of today, STOP taking any Aspirin (unless otherwise instructed by your surgeon) and Aspirin containing products, Aleve, Naproxen, Ibuprofen, Motrin, Advil, Goody's, BC's, all herbal  medications, fish oil, and all vitamins.                      Do not wear jewelry, make up, or nail polish            Do not wear lotions, powders, perfumes/colognes, or deodorant.            Do not shave 48 hours prior to surgery.  Men may shave face and neck.            Do not bring valuables to the hospital.            [facility] is not responsible for any belongings or valuables.  Do NOT Smoke (Tobacco/Vapping) or drink Alcohol 24 hours prior to your procedure If you use a CPAP at night, you may bring all equipment for your overnight stay.   Contacts, glasses, dentures or bridgework may not be worn into surgery.      For patients admitted to the hospital, discharge time will be determined by your treatment team.   Patients discharged the day of surgery will not be allowed to drive home, and someone needs to stay with them for 24 hours.    Special instructions:   [place]- Preparing For Surgery  Before surgery, you can play an important role. Because skin is not sterile, your skin needs to be as free of germs as possible. You can reduce the number of germs on your  skin by washing with CHG (chlorahexidine gluconate) Soap before surgery.  CHG is an antiseptic cleaner which kills germs and bonds with the skin to continue killing germs even after washing.    Oral Hygiene is also important to reduce your risk of infection.  Remember - BRUSH YOUR TEETH THE MORNING OF SURGERY WITH YOUR REGULAR TOOTHPASTE  Please do not use if you have an allergy to CHG or antibacterial soaps. If your skin becomes reddened/irritated stop using the CHG.  Do not shave (including legs and underarms) for at least 48 hours prior to first CHG shower. It is OK to shave your face.  Please follow these instructions carefully.   1. Shower the NIGHT BEFORE SURGERY and the MORNING OF SURGERY with CHG Soap.   2. If you chose to wash your hair, wash your hair first as usual with your normal shampoo.  3. After  you shampoo, rinse your hair and body thoroughly to remove the shampoo.  4. Use CHG as you would any other liquid soap. You can apply CHG directly to the skin and wash gently with a scrungie or a clean washcloth.   5. Apply the CHG Soap to your body ONLY FROM THE NECK DOWN.  Do not use on open wounds or open sores. Avoid contact with your eyes, ears, mouth and genitals (private parts). Wash Face and genitals (private parts)  with your normal soap.   6. Wash thoroughly, paying special attention to the area where your surgery will be performed.  7. Thoroughly rinse your body with warm water from the neck down.  8. DO NOT shower/wash with your normal soap after using and rinsing off the CHG Soap.  9. Pat yourself dry with a CLEAN TOWEL.  10. Wear CLEAN PAJAMAS to bed the night before surgery, wear comfortable clothes the morning of surgery  11. Place CLEAN SHEETS on your bed the night of your first shower and DO NOT SLEEP WITH PETS.   Day of Surgery:   Do not apply any deodorants/lotions.  Please wear clean clothes to the hospital/surgery center.   Remember to brush your teeth WITH YOUR REGULAR TOOTHPASTE.   Please read over the following fact sheets that you were given.

## 2019-07-24 ENCOUNTER — Other Ambulatory Visit: Payer: Self-pay

## 2019-07-24 ENCOUNTER — Encounter: Payer: Self-pay | Admitting: *Deleted

## 2019-07-24 ENCOUNTER — Telehealth: Payer: Self-pay | Admitting: *Deleted

## 2019-07-24 ENCOUNTER — Encounter (HOSPITAL_COMMUNITY): Payer: Self-pay

## 2019-07-24 ENCOUNTER — Encounter (HOSPITAL_COMMUNITY)
Admission: RE | Admit: 2019-07-24 | Discharge: 2019-07-24 | Disposition: A | Discharge status: Home / Self Care | Payer: Federal, State, Local not specified - PPO | Source: Ambulatory Visit | Attending: General Surgery | Admitting: General Surgery

## 2019-07-24 DIAGNOSIS — Z01812 Encounter for preprocedural laboratory examination: Secondary | ICD-10-CM | POA: Insufficient documentation

## 2019-07-24 DIAGNOSIS — Z20822 Contact with and (suspected) exposure to covid-19: Secondary | ICD-10-CM | POA: Insufficient documentation

## 2019-07-24 LAB — CBC
HCT: 41.5 % (ref 36.0–46.0)
Hemoglobin: 13.8 g/dL (ref 12.0–15.0)
MCH: 28.9 pg (ref 26.0–34.0)
MCHC: 33.3 g/dL (ref 30.0–36.0)
MCV: 87 fL (ref 80.0–100.0)
Platelets: 416 10*3/uL — ABNORMAL HIGH (ref 150–400)
RBC: 4.77 MIL/uL (ref 3.87–5.11)
RDW: 13.5 % (ref 11.5–15.5)
WBC: 8.9 10*3/uL (ref 4.0–10.5)
nRBC: 0 % (ref 0.0–0.2)

## 2019-07-24 LAB — BASIC METABOLIC PANEL
Anion gap: 9 (ref 5–15)
BUN: 19 mg/dL (ref 6–20)
CO2: 27 mmol/L (ref 22–32)
Calcium: 9.4 mg/dL (ref 8.9–10.3)
Chloride: 101 mmol/L (ref 98–111)
Creatinine, Ser: 0.81 mg/dL (ref 0.44–1.00)
GFR calc Af Amer: >60 mL/min (ref >60)
GFR calc non Af Amer: >60 mL/min (ref >60)
Glucose, Bld: 90 mg/dL (ref 70–99)
Potassium: 4.4 mmol/L (ref 3.5–5.1)
Sodium: 137 mmol/L (ref 135–145)

## 2019-07-24 LAB — POCT PREGNANCY, URINE: Preg Test, Ur: NEGATIVE

## 2019-07-24 LAB — HEMOGLOBIN A1C
Hgb A1c MFr Bld: 5.9 % — ABNORMAL HIGH (ref 4.8–5.6)
Mean Plasma Glucose: 122.63 mg/dL

## 2019-07-24 LAB — GLUCOSE, CAPILLARY: Glucose-Capillary: 85 mg/dL (ref 70–99)

## 2019-07-24 NOTE — Telephone Encounter (Signed)
Spoke with patient to follow up from [name] and assess navigation needs. No questions or concerns at this time.  Patient is aware of her appointments.

## 2019-07-24 NOTE — Pre-Procedure Instructions (Addendum)
[id number] [place], [place] - [place] [address] [place] [address] Phone: [phone] Fax: [phone]      Your procedure is scheduled on Friday July 9th.  Report to [facility] Main Entrance "A" at 5:30 A.M., and check in at the Admitting office.  Call this number if you have problems the morning of surgery:  [phone]  Call [phone] if you have any questions prior to your surgery date Monday-Friday 8am-4pm    Remember:  Do not eat  after midnight the night before your surgery  You may drink clear liquids until 4:30am the morning of your surgery.   Clear liquids allowed are: Water, Non-Citrus Juices (without pulp), Carbonated Beverages, Clear Tea, Black Coffee Only, and Gatorade   Please complete your PRE-SURGERY ENSURE that was provided to you by 4:30 A.M. the morning of surgery.  Please, if able, drink it in one setting. DO NOT SIP.      Take these medicines the morning of surgery with A SIP OF WATER   amLODipine (NORVASC) 5 MG tablet  FLUoxetine (PROZAC) 40 MG capsule   As of today, STOP taking any Aspirin (unless otherwise instructed by your surgeon) and Aspirin containing products, Aleve, Naproxen, Ibuprofen, Motrin, Advil, Goody's, BC's, all herbal medications, fish oil, and all vitamins.                      Do not wear jewelry, make up, or nail polish            Do not wear lotions, powders, perfumes/colognes, or deodorant.            Do not shave 48 hours prior to surgery.  Men may shave face and neck.            Do not bring valuables to the hospital.            [facility] is not responsible for any belongings or valuables.  Do NOT Smoke (Tobacco/Vapping) or drink Alcohol 24 hours prior to your procedure If you use a CPAP at night, you may bring all equipment for your overnight stay.   Contacts, glasses, dentures or bridgework may not be worn into surgery.      For patients  admitted to the hospital, discharge time will be determined by your treatment team.   Patients discharged the day of surgery will not be allowed to drive home, and someone needs to stay with them for 24 hours.    Special instructions:   [place]- Preparing For Surgery  Before surgery, you can play an important role. Because skin is not sterile, your skin needs to be as free of germs as possible. You can reduce the number of germs on your skin by washing with CHG (chlorahexidine gluconate) Soap before surgery.  CHG is an antiseptic cleaner which kills germs and bonds with the skin to continue killing germs even after washing.    Oral Hygiene is also important to reduce your risk of infection.  Remember - BRUSH YOUR TEETH THE MORNING OF SURGERY WITH YOUR REGULAR TOOTHPASTE  Please do not use if you have an allergy to CHG or antibacterial soaps. If your skin becomes reddened/irritated stop using the CHG.  Do not shave (including legs and underarms) for at least 48 hours prior to first CHG shower. It is OK to shave your face.  Please follow these instructions carefully.   1. Shower the  NIGHT BEFORE SURGERY and the MORNING OF SURGERY with CHG Soap.   2. If you chose to wash your hair, wash your hair first as usual with your normal shampoo.  3. After you shampoo, rinse your hair and body thoroughly to remove the shampoo.  4. Use CHG as you would any other liquid soap. You can apply CHG directly to the skin and wash gently with a scrungie or a clean washcloth.   5. Apply the CHG Soap to your body ONLY FROM THE NECK DOWN.  Do not use on open wounds or open sores. Avoid contact with your eyes, ears, mouth and genitals (private parts). Wash Face and genitals (private parts)  with your normal soap.   6. Wash thoroughly, paying special attention to the area where your surgery will be performed.  7. Thoroughly rinse your body with warm water from the neck down.  8. DO NOT shower/wash with your  normal soap after using and rinsing off the CHG Soap.  9. Pat yourself dry with a CLEAN TOWEL.  10. Wear CLEAN PAJAMAS to bed the night before surgery, wear comfortable clothes the morning of surgery  11. Place CLEAN SHEETS on your bed the night of your first shower and DO NOT SLEEP WITH PETS.   Day of Surgery:   Do not apply any deodorants/lotions.  Please wear clean clothes to the hospital/surgery center.   Remember to brush your teeth WITH YOUR REGULAR TOOTHPASTE.   Please read over the following fact sheets that you were given.

## 2019-07-24 NOTE — Progress Notes (Signed)
PCP - Maurice Small @ [facility] Cardiologist - na    Chest x-ray - na EKG - 09/25/18 Stress Test - na ECHO - na Cardiac Cath - na  Sleep Study - na   Fasting Blood Sugar - doesn't check Checks Blood Sugar ___0__ times a day  Blood Thinner Instructions: na Aspirin Instructions: na  ERAS Protcol -yes PRE-SURGERY G2-   given  COVID TEST- 07/29/19  Requested hgbA1C from Dr. Jason Nest office  Anesthesia review: ekg  Patient denies shortness of breath, fever, cough and chest pain at PAT appointment   All instructions explained to the patient, with a verbal understanding of the material. Patient agrees to go over the instructions while at home for a better understanding. Patient also instructed to self quarantine after being tested for COVID-19. The opportunity to ask questions was provided.

## 2019-07-25 ENCOUNTER — Encounter: Payer: Self-pay | Admitting: Licensed Clinical Social Worker

## 2019-07-25 NOTE — Progress Notes (Signed)
[place] CSW Progress Note  Clinical Education officer, museum contacted patient by phone to follow-up after [place].  [name] is feeling much better since [facility] with adjusting to diagnosis. She still worries about recurrence even though she knows it was caught early and she will undergo treatment. Worry is not interfering with daily activities and she is able to focus on other things. CSW normalized and validated feelings, encouraged her to continue using coping skills including her walking.  [name] has told more of her family members, including her children and most of her siblings. They are doing well with the news and have offered to support in any way she needs. Her sister will help with rides to treatment.   Patient is ready for her surgery next week. CSW will check-in the following week, likely on 7/16 when patient sees Dr. Lindi Adie.    [name] , LCSW

## 2019-07-29 ENCOUNTER — Other Ambulatory Visit (HOSPITAL_COMMUNITY)
Admission: RE | Admit: 2019-07-29 | Discharge: 2019-07-29 | Disposition: A | Discharge status: Home / Self Care | Payer: Federal, State, Local not specified - PPO | Source: Ambulatory Visit | Attending: General Surgery | Admitting: General Surgery

## 2019-07-29 DIAGNOSIS — Z01812 Encounter for preprocedural laboratory examination: Secondary | ICD-10-CM | POA: Insufficient documentation

## 2019-07-29 DIAGNOSIS — Z20822 Contact with and (suspected) exposure to covid-19: Secondary | ICD-10-CM | POA: Diagnosis not present

## 2019-07-29 LAB — SARS CORONAVIRUS 2 (TAT 6-24 HRS): SARS Coronavirus 2: NEGATIVE

## 2019-07-30 NOTE — H&P (Signed)
[name] Appointment: 07/16/2019 9:00 AM Location: [facility] Patient #: [id number] DOB: [birth year] Single / Language: [name] / Race: Black or African American Female   History of Present Illness Leah Klein MD; 07/18/2019 2:21 PM) The patient is a 54 year old female who presents with breast cancer. Pt is a 54 yo F who is referred for consultation by Dr. Luan Pulling for a new diagnosis of left breast cancer 06/2019. She presented with screening detected calcifications. Diagnostic imaging confirmed this. She had 2 cm of calcifications in the upper outer quadrant. Core needle biopsy was performed and showed intermediate grade DCIS with necrosis and question of microinvasive disease. She has no personal or family history of cancer. She denies other breast issues.   She is a Advertising copywriter. She does not recall when menarche was.   She has had a sleeve with Dr. Redmond Pulling.   Imaging is at [place]. we reviewed images and reports in multidisciplinary fashion.  dx mammogram was 06/25/19. This showed 2 cm of predominantly amorphous calcifications in the left breast at 12:30-1 o'clock in the posterior depth, 8 cm from the nipple.   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 MICROINVASION CANNOT BE EXCLUDED. Estrogen Receptor: 95%, POSITIVE, STRONG STAINING INTENSITY Progesterone Receptor: 20%, POSITIVE, STRONG STAINING INTENSITY  CMET and CBC 07/16/2019 normal.    Medication History Conni Slipper, RN; 07/16/2019 8:13 AM) Medications Reconciled    Review of Systems Leah Klein MD; 07/18/2019 2:21 PM) All other systems negative  Vitals Leah Klein MD; 07/16/2019 11:29 AM) 07/16/2019 11:29 AM Weight: 174.3 lb Height: 65in Body Surface Area: 1.87 m Body Mass Index: 29 kg/m  Temp.: 99.27F  Pulse: 68 (Regular)  Resp.: 17 (Unlabored)  BP: 121/88(Sitting, Left Arm,  Standard)       Physical Exam Leah Klein MD; 07/18/2019 2:23 PM) General Mental Status-Alert. General Appearance-Consistent with stated age. Hydration-Well hydrated. Voice-Normal.  Head and Neck Head-normocephalic, atraumatic with no lesions or palpable masses. Trachea-midline. Thyroid Gland Characteristics - normal size and consistency.  Eye Eyeball - Bilateral-Extraocular movements intact. Sclera/Conjunctiva - Bilateral-No scleral icterus.  Chest and Lung Exam Chest and lung exam reveals -quiet, even and easy respiratory effort with no use of accessory muscles and on auscultation, normal breath sounds, no adventitious sounds and normal vocal resonance. Inspection Chest Wall - Normal. Back - normal.  Breast Note: breasts relatively symmetric. no palpable masses. no skin dimpling. no nipple retraction or nipple discharge. no LAD. faint bruising and mild tenderness at biopsy site.   Cardiovascular Cardiovascular examination reveals -normal heart sounds, regular rate and rhythm with no murmurs and normal pedal pulses bilaterally.  Abdomen Inspection Inspection of the abdomen reveals - No Hernias. Palpation/Percussion Palpation and Percussion of the abdomen reveal - Soft, Non Tender, No Rebound tenderness, No Rigidity (guarding) and No hepatosplenomegaly. Auscultation Auscultation of the abdomen reveals - Bowel sounds normal.  Neurologic Neurologic evaluation reveals -alert and oriented x 3 with no impairment of recent or remote memory. Mental Status-Normal.  Musculoskeletal Global Assessment -Note: no gross deformities.  Normal Exam - Left-Upper Extremity Strength Normal and Lower Extremity Strength Normal. Normal Exam - Right-Upper Extremity Strength Normal and Lower Extremity Strength Normal.  Lymphatic Head & Neck  General Head & Neck Lymphatics: Bilateral - Description - Normal. Axillary  General Axillary Region:  Bilateral - Description - Normal. Tenderness - Non Tender. Femoral & Inguinal  Generalized Femoral & Inguinal Lymphatics: Bilateral - Description - No Generalized lymphadenopathy.  Assessment & Plan Leah Klein MD; 07/18/2019 2:24 PM) MALIGNANT NEOPLASM OF UPPER-OUTER QUADRANT OF LEFT BREAST IN FEMALE, ESTROGEN RECEPTOR POSITIVE (C50.412) Impression: Pt with new diagnosis of cTis left breast cancer, but with possibility of invasion.  Will plan left seed localized lumpectomy with sentinel lymph node biopsy. This will be followed by radiation and antiestrogen tx.  The surgical procedure was described to the patient. I discussed the incision type and location and that we would need radiology involved on with a wire or seed marker and/or sentinel node.  The risks and benefits of the procedure were described to the patient and she wishes to proceed.  We discussed the risks bleeding, infection, damage to other structures, need for further procedures/surgeries. We discussed the risk of seroma. The patient was advised if the area in the breast in cancer, we may need to go back to surgery for additional tissue to obtain negative margins or for a lymph node biopsy. The patient was advised that these are the most common complications, but that others can occur as well. They were advised against taking aspirin or other anti-inflammatory agents/blood thinners the week before surgery. Current Plans You are being scheduled for surgery- Our schedulers will call you.  You should hear from our office's scheduling department within 5 working days about the location, date, and time of surgery. We try to make accommodations for patient's preferences in scheduling surgery, but sometimes the OR schedule or the surgeon's schedule prevents [place] from making those accommodations.  If you have not heard from our office [phone]) in 5 working days, call the office and ask for your surgeon's nurse.  If you have  other questions about your diagnosis, plan, or surgery, call the office and ask for your surgeon's nurse.  Pt Education - flb breast cancer surgery: discussed with patient and provided information.   Signed electronically by Leah Klein, MD (07/18/2019 2:25 PM)

## 2019-07-31 NOTE — Anesthesia Preprocedure Evaluation (Addendum)
Anesthesia Evaluation  Patient identified by MRN, date of birth, ID band Patient awake    Reviewed: Allergy & Precautions, NPO status , Patient's Chart, lab work & pertinent test results  Airway Mallampati: II  TM Distance: >3 FB Neck ROM: Full    Dental no notable dental hx. (+) Teeth Intact, Dental Advisory Given   Pulmonary neg pulmonary ROS,    Pulmonary exam normal breath sounds clear to auscultation       Cardiovascular hypertension, Pt. on medications Normal cardiovascular exam Rhythm:Regular Rate:Normal     Neuro/Psych negative neurological ROS  negative psych ROS   GI/Hepatic negative GI ROS, Neg liver ROS,   Endo/Other  diabetesDiet controlled  Renal/GU negative Renal ROS     Musculoskeletal negative musculoskeletal ROS (+)   Abdominal   Peds  Hematology   Anesthesia Other Findings   Reproductive/Obstetrics                            Anesthesia Physical Anesthesia Plan  ASA: II  Anesthesia Plan: General   Post-op Pain Management:  Regional for Post-op pain   Induction: Intravenous  PONV Risk Score and Plan: 3 and Treatment may vary due to age or medical condition, Ondansetron and Dexamethasone  Airway Management Planned: LMA  Additional Equipment: None  Intra-op Plan:   Post-operative Plan:   Informed Consent: I have reviewed the patients History and Physical, chart, labs and discussed the procedure including the risks, benefits and alternatives for the proposed anesthesia with the patient or authorized representative who has indicated his/her understanding and acceptance.     Dental advisory given  Plan Discussed with:   Anesthesia Plan Comments: (LMA w L PEc block)       Anesthesia Quick Evaluation

## 2019-08-01 ENCOUNTER — Ambulatory Visit (HOSPITAL_COMMUNITY): Payer: Federal, State, Local not specified - PPO | Admitting: Physician Assistant

## 2019-08-01 ENCOUNTER — Ambulatory Visit (HOSPITAL_COMMUNITY): Payer: Federal, State, Local not specified - PPO | Admitting: Anesthesiology

## 2019-08-01 ENCOUNTER — Other Ambulatory Visit: Payer: Self-pay

## 2019-08-01 ENCOUNTER — Ambulatory Visit (HOSPITAL_COMMUNITY)
Admission: RE | Admit: 2019-08-01 | Discharge: 2019-08-01 | Disposition: A | Discharge status: Home / Self Care | Payer: Federal, State, Local not specified - PPO | Attending: General Surgery | Admitting: General Surgery

## 2019-08-01 ENCOUNTER — Encounter (HOSPITAL_COMMUNITY)
Admission: RE | Disposition: A | Discharge status: Home / Self Care | Payer: Self-pay | Source: Home / Self Care | Attending: General Surgery

## 2019-08-01 ENCOUNTER — Encounter (HOSPITAL_COMMUNITY)
Admission: RE | Admit: 2019-08-01 | Discharge: 2019-08-01 | Disposition: A | Discharge status: Home / Self Care | Payer: Federal, State, Local not specified - PPO | Source: Ambulatory Visit | Attending: General Surgery | Admitting: General Surgery

## 2019-08-01 ENCOUNTER — Encounter (HOSPITAL_COMMUNITY): Payer: Self-pay | Admitting: General Surgery

## 2019-08-01 DIAGNOSIS — C50412 Malignant neoplasm of upper-outer quadrant of left female breast: Secondary | ICD-10-CM | POA: Diagnosis not present

## 2019-08-01 DIAGNOSIS — I10 Essential (primary) hypertension: Secondary | ICD-10-CM | POA: Insufficient documentation

## 2019-08-01 DIAGNOSIS — D242 Benign neoplasm of left breast: Secondary | ICD-10-CM | POA: Diagnosis not present

## 2019-08-01 DIAGNOSIS — C50912 Malignant neoplasm of unspecified site of left female breast: Secondary | ICD-10-CM | POA: Diagnosis not present

## 2019-08-01 DIAGNOSIS — G8918 Other acute postprocedural pain: Secondary | ICD-10-CM | POA: Diagnosis not present

## 2019-08-01 DIAGNOSIS — E119 Type 2 diabetes mellitus without complications: Secondary | ICD-10-CM | POA: Diagnosis not present

## 2019-08-01 DIAGNOSIS — Z170 Estrogen receptor positive status [ER+]: Secondary | ICD-10-CM

## 2019-08-01 DIAGNOSIS — N6012 Diffuse cystic mastopathy of left breast: Secondary | ICD-10-CM | POA: Diagnosis not present

## 2019-08-01 DIAGNOSIS — D0512 Intraductal carcinoma in situ of left breast: Secondary | ICD-10-CM | POA: Diagnosis not present

## 2019-08-01 HISTORY — PX: BREAST LUMPECTOMY WITH RADIOACTIVE SEED AND SENTINEL LYMPH NODE BIOPSY: SHX6550

## 2019-08-01 LAB — GLUCOSE, CAPILLARY
Glucose-Capillary: 100 mg/dL — ABNORMAL HIGH (ref 70–99)
Glucose-Capillary: 118 mg/dL — ABNORMAL HIGH (ref 70–99)

## 2019-08-01 SURGERY — BREAST LUMPECTOMY WITH RADIOACTIVE SEED AND SENTINEL LYMPH NODE BIOPSY
Anesthesia: General | Site: Breast | Laterality: Left

## 2019-08-01 MED ORDER — OXYCODONE HCL 5 MG PO TABS
5.0000 mg | ORAL_TABLET | Freq: Four times a day (QID) | ORAL | 0 refills | Status: DC | PRN
Start: 2019-08-01 — End: 2019-10-27

## 2019-08-01 MED ORDER — PROPOFOL 10 MG/ML IV BOLUS
INTRAVENOUS | Status: AC
  Filled 2019-08-01: qty 20

## 2019-08-01 MED ORDER — DEXMEDETOMIDINE HCL 200 MCG/2ML IV SOLN
INTRAVENOUS | Status: DC | PRN
  Administered 2019-08-01: 12 ug via INTRAVENOUS
  Administered 2019-08-01: 16 ug via INTRAVENOUS

## 2019-08-01 MED ORDER — ENSURE PRE-SURGERY PO LIQD: 296.0000 mL | Freq: Once | ORAL | Status: DC

## 2019-08-01 MED ORDER — DEXAMETHASONE SODIUM PHOSPHATE 4 MG/ML IJ SOLN
INTRAMUSCULAR | Status: DC | PRN
Start: 2019-08-01 — End: 2019-08-01
  Administered 2019-08-01: 4 mg via INTRAVENOUS

## 2019-08-01 MED ORDER — BUPIVACAINE HCL (PF) 0.25 % IJ SOLN
INTRAMUSCULAR | Status: DC | PRN
  Administered 2019-08-01: .1 mL

## 2019-08-01 MED ORDER — TECHNETIUM TC 99M SULFUR COLLOID FILTERED
1.0000 | Freq: Once | INTRAVENOUS | Status: AC | PRN
  Administered 2019-08-01: 1 via INTRADERMAL

## 2019-08-01 MED ORDER — LIDOCAINE 2% (20 MG/ML) 5 ML SYRINGE
INTRAMUSCULAR | Status: AC
  Filled 2019-08-01: qty 5

## 2019-08-01 MED ORDER — DROPERIDOL 2.5 MG/ML IJ SOLN: 0.6250 mg | Freq: Once | INTRAMUSCULAR | Status: DC | PRN

## 2019-08-01 MED ORDER — SODIUM CHLORIDE (PF) 0.9 % IJ SOLN: INTRAVENOUS | Status: DC | PRN

## 2019-08-01 MED ORDER — BUPIVACAINE HCL (PF) 0.25 % IJ SOLN
INTRAMUSCULAR | Status: AC
  Filled 2019-08-01: qty 30

## 2019-08-01 MED ORDER — HYDROMORPHONE HCL 1 MG/ML IJ SOLN: 0.2500 mg | INTRAMUSCULAR | Status: DC | PRN

## 2019-08-01 MED ORDER — FENTANYL CITRATE (PF) 250 MCG/5ML IJ SOLN
INTRAMUSCULAR | Status: AC
  Filled 2019-08-01: qty 5

## 2019-08-01 MED ORDER — GLYCOPYRROLATE PF 0.2 MG/ML IJ SOSY
PREFILLED_SYRINGE | INTRAMUSCULAR | Status: DC | PRN
  Administered 2019-08-01: .2 mg via INTRAVENOUS

## 2019-08-01 MED ORDER — OXYCODONE HCL 5 MG PO TABS
5.0000 mg | ORAL_TABLET | Freq: Once | ORAL | Status: AC | PRN
  Administered 2019-08-01: 5 mg via ORAL

## 2019-08-01 MED ORDER — OXYCODONE HCL 5 MG/5ML PO SOLN: 5.0000 mg | Freq: Once | ORAL | Status: AC | PRN

## 2019-08-01 MED ORDER — ONDANSETRON HCL 4 MG/2ML IJ SOLN
INTRAMUSCULAR | Status: DC | PRN
  Administered 2019-08-01: 4 mg via INTRAVENOUS

## 2019-08-01 MED ORDER — GLYCOPYRROLATE PF 0.2 MG/ML IJ SOSY
PREFILLED_SYRINGE | INTRAMUSCULAR | Status: AC
  Filled 2019-08-01: qty 1

## 2019-08-01 MED ORDER — SODIUM CHLORIDE (PF) 0.9 % IJ SOLN
INTRAMUSCULAR | Status: AC
  Filled 2019-08-01: qty 10

## 2019-08-01 MED ORDER — LACTATED RINGERS IV SOLN: INTRAVENOUS | Status: DC | PRN

## 2019-08-01 MED ORDER — ORAL CARE MOUTH RINSE: 15.0000 mL | Freq: Once | OROMUCOSAL | Status: AC

## 2019-08-01 MED ORDER — MIDAZOLAM HCL 5 MG/5ML IJ SOLN
INTRAMUSCULAR | Status: DC | PRN
  Administered 2019-08-01: 2 mg via INTRAVENOUS

## 2019-08-01 MED ORDER — DEXAMETHASONE SODIUM PHOSPHATE 10 MG/ML IJ SOLN
INTRAMUSCULAR | Status: AC
  Filled 2019-08-01: qty 1

## 2019-08-01 MED ORDER — CLONIDINE HCL (ANALGESIA) 100 MCG/ML EP SOLN
EPIDURAL | Status: DC | PRN
Start: 2019-08-01 — End: 2019-08-01
  Administered 2019-08-01: 100 ug

## 2019-08-01 MED ORDER — CHLORHEXIDINE GLUCONATE CLOTH 2 % EX PADS: 6.0000 | MEDICATED_PAD | Freq: Once | CUTANEOUS | Status: DC

## 2019-08-01 MED ORDER — LIDOCAINE 2% (20 MG/ML) 5 ML SYRINGE
INTRAMUSCULAR | Status: DC | PRN
  Administered 2019-08-01: 100 mg via INTRAVENOUS

## 2019-08-01 MED ORDER — LACTATED RINGERS IV SOLN: INTRAVENOUS | Status: DC

## 2019-08-01 MED ORDER — LIDOCAINE-EPINEPHRINE 1 %-1:100000 IJ SOLN
INTRAMUSCULAR | Status: AC
  Filled 2019-08-01: qty 1

## 2019-08-01 MED ORDER — LIDOCAINE-EPINEPHRINE 1 %-1:100000 IJ SOLN
INTRAMUSCULAR | Status: DC | PRN
  Administered 2019-08-01: 20 mL

## 2019-08-01 MED ORDER — CHLORHEXIDINE GLUCONATE 0.12 % MT SOLN
15.0000 mL | Freq: Once | OROMUCOSAL | Status: AC
  Administered 2019-08-01: 15 mL via OROMUCOSAL
  Filled 2019-08-01: qty 15

## 2019-08-01 MED ORDER — DEXMEDETOMIDINE HCL IN NACL 200 MCG/50ML IV SOLN
INTRAVENOUS | Status: AC
  Filled 2019-08-01: qty 50

## 2019-08-01 MED ORDER — MIDAZOLAM HCL 2 MG/2ML IJ SOLN
INTRAMUSCULAR | Status: AC
  Filled 2019-08-01: qty 2

## 2019-08-01 MED ORDER — METHYLENE BLUE 0.5 % INJ SOLN
INTRAVENOUS | Status: AC
  Filled 2019-08-01: qty 10

## 2019-08-01 MED ORDER — ROPIVACAINE HCL 5 MG/ML IJ SOLN
INTRAMUSCULAR | Status: DC | PRN
  Administered 2019-08-01: 30 mL

## 2019-08-01 MED ORDER — CEFAZOLIN SODIUM-DEXTROSE 2-4 GM/100ML-% IV SOLN
2.0000 g | INTRAVENOUS | Status: AC
  Administered 2019-08-01: 2 g via INTRAVENOUS
  Filled 2019-08-01: qty 100

## 2019-08-01 MED ORDER — ACETAMINOPHEN 500 MG PO TABS
1000.0000 mg | ORAL_TABLET | ORAL | Status: AC
  Administered 2019-08-01: 1000 mg via ORAL
  Filled 2019-08-01: qty 2

## 2019-08-01 MED ORDER — ONDANSETRON HCL 4 MG/2ML IJ SOLN: 4.0000 mg | Freq: Once | INTRAMUSCULAR | Status: AC | PRN

## 2019-08-01 MED ORDER — ONDANSETRON HCL 4 MG/2ML IJ SOLN
INTRAMUSCULAR | Status: AC
  Filled 2019-08-01: qty 2

## 2019-08-01 MED ORDER — ACETAMINOPHEN 10 MG/ML IV SOLN: 1000.0000 mg | Freq: Once | INTRAVENOUS | Status: DC | PRN

## 2019-08-01 MED ORDER — ONDANSETRON HCL 4 MG/2ML IJ SOLN
INTRAMUSCULAR | Status: AC
  Administered 2019-08-01: 4 mg via INTRAVENOUS
  Filled 2019-08-01: qty 2

## 2019-08-01 MED ORDER — 0.9 % SODIUM CHLORIDE (POUR BTL) OPTIME
TOPICAL | Status: DC | PRN
  Administered 2019-08-01: 1000 mL

## 2019-08-01 MED ORDER — OXYCODONE HCL 5 MG PO TABS
ORAL_TABLET | ORAL | Status: AC
  Filled 2019-08-01: qty 1

## 2019-08-01 MED ORDER — PROPOFOL 10 MG/ML IV BOLUS
INTRAVENOUS | Status: DC | PRN
  Administered 2019-08-01: 120 mg via INTRAVENOUS

## 2019-08-01 SURGICAL SUPPLY — 52 items
ADH SKN CLS APL DERMABOND .7 (GAUZE/BANDAGES/DRESSINGS) ×1
APL PRP STRL LF DISP 70% ISPRP (MISCELLANEOUS) ×1
BINDER BREAST LRG (GAUZE/BANDAGES/DRESSINGS) IMPLANT
BINDER BREAST XLRG (GAUZE/BANDAGES/DRESSINGS) IMPLANT
BINDER BREAST XXLRG (GAUZE/BANDAGES/DRESSINGS) ×1 IMPLANT
BNDG COHESIVE 4X5 TAN STRL (GAUZE/BANDAGES/DRESSINGS) ×2 IMPLANT
CANISTER SUCT 3000ML PPV (MISCELLANEOUS) ×2 IMPLANT
CHLORAPREP W/TINT 26 (MISCELLANEOUS) ×2 IMPLANT
CLIP VESOCCLUDE LG 6/CT (CLIP) ×2 IMPLANT
CLIP VESOCCLUDE MED 24/CT (CLIP) ×1 IMPLANT
CLIP VESOCCLUDE MED 6/CT (CLIP) ×2 IMPLANT
CLIP VESOCCLUDE SM WIDE 6/CT (CLIP) ×2 IMPLANT
CNTNR URN SCR LID CUP LEK RST (MISCELLANEOUS) IMPLANT
CONT SPEC 4OZ STRL OR WHT (MISCELLANEOUS)
COVER PROBE W GEL 5X96 (DRAPES) ×2 IMPLANT
COVER SURGICAL LIGHT HANDLE (MISCELLANEOUS) ×2 IMPLANT
COVER WAND RF STERILE (DRAPES) ×2 IMPLANT
DERMABOND ADVANCED (GAUZE/BANDAGES/DRESSINGS) ×1
DERMABOND ADVANCED .7 DNX12 (GAUZE/BANDAGES/DRESSINGS) ×1 IMPLANT
DEVICE DUBIN SPECIMEN MAMMOGRA (MISCELLANEOUS) IMPLANT
DRAPE CHEST BREAST 15X10 FENES (DRAPES) ×2 IMPLANT
ELECT COATED BLADE 2.86 ST (ELECTRODE) ×2 IMPLANT
ELECT NDL BLADE 2-5/6 (NEEDLE) ×1 IMPLANT
ELECT NEEDLE BLADE 2-5/6 (NEEDLE) ×2 IMPLANT
ELECT REM PT RETURN 9FT ADLT (ELECTROSURGICAL) ×2
ELECTRODE REM PT RTRN 9FT ADLT (ELECTROSURGICAL) ×1 IMPLANT
GLOVE BIO SURGEON STRL SZ 6 (GLOVE) ×2 IMPLANT
GLOVE INDICATOR 6.5 STRL GRN (GLOVE) ×2 IMPLANT
GOWN STRL REUS W/ TWL LRG LVL3 (GOWN DISPOSABLE) ×1 IMPLANT
GOWN STRL REUS W/TWL 2XL LVL3 (GOWN DISPOSABLE) ×2 IMPLANT
GOWN STRL REUS W/TWL LRG LVL3 (GOWN DISPOSABLE) ×2
KIT BASIN OR (CUSTOM PROCEDURE TRAY) ×2 IMPLANT
KIT MARKER MARGIN INK (KITS) ×2 IMPLANT
LIGHT WAVEGUIDE WIDE FLAT (MISCELLANEOUS) IMPLANT
NDL 18GX1X1/2 (RX/OR ONLY) (NEEDLE) IMPLANT
NDL FILTER BLUNT 18X1 1/2 (NEEDLE) IMPLANT
NDL HYPO 25GX1X1/2 BEV (NEEDLE) ×1 IMPLANT
NEEDLE 18GX1X1/2 (RX/OR ONLY) (NEEDLE) IMPLANT
NEEDLE FILTER BLUNT 18X 1/2SAF (NEEDLE)
NEEDLE FILTER BLUNT 18X1 1/2 (NEEDLE) IMPLANT
NEEDLE HYPO 25GX1X1/2 BEV (NEEDLE) ×2 IMPLANT
NS IRRIG 1000ML POUR BTL (IV SOLUTION) ×2 IMPLANT
PACK GENERAL/GYN (CUSTOM PROCEDURE TRAY) ×2 IMPLANT
PACK UNIVERSAL I (CUSTOM PROCEDURE TRAY) ×2 IMPLANT
PAD ABD 8X10 STRL (GAUZE/BANDAGES/DRESSINGS) ×1 IMPLANT
PENCIL SMOKE EVACUATOR (MISCELLANEOUS) ×1 IMPLANT
STOCKINETTE IMPERVIOUS 9X36 MD (GAUZE/BANDAGES/DRESSINGS) ×2 IMPLANT
SUT MNCRL AB 4-0 PS2 18 (SUTURE) ×2 IMPLANT
SUT VIC AB 3-0 SH 8-18 (SUTURE) ×2 IMPLANT
SYR CONTROL 10ML LL (SYRINGE) ×2 IMPLANT
TOWEL GREEN STERILE (TOWEL DISPOSABLE) ×2 IMPLANT
TOWEL GREEN STERILE FF (TOWEL DISPOSABLE) ×2 IMPLANT

## 2019-08-01 NOTE — Op Note (Addendum)
Right Breast Radioactive seed bracketed lumpectomy and sentinel lymph node mapping and biopsy  Indications: This patient presents with history of left breast cancer, cTis, upper outer quadrant.  Grade 2 with suspicion of microinvasion, ER/PR +, calcifications measuring  2 cm.    Pre-operative Diagnosis: left breast cancer cTis  Post-operative Diagnosis: Same  Surgeon: Stark Klein   Anesthesia: General endotracheal anesthesia  ASA Class: 2  Procedure Details  The patient was seen in the Holding Room. The risks, benefits, complications, treatment options, and expected outcomes were discussed with the patient. The possibilities of bleeding, infection, the need for additional procedures, failure to diagnose a condition, and creating a complication requiring transfusion or operation were discussed with the patient. The patient concurred with the proposed plan, giving informed consent.  The site of surgery properly noted/marked. The patient was taken to Operating Room # 7, identified, and the procedure verified as left Breast seed bracketed Lumpectomy with sentinel lymph node biopsy. A Time Out was held and the above information confirmed. Methylene blue was administered in the subareolar tissue.    The left arm, breast, and chest were prepped and draped in standard fashion. The lumpectomy was performed by creating an axillary incision near the previously placed radioactive seed.  Dissection was carried down to around the point of maximum signal intensity. The cautery was used to perform the dissection.  Hemostasis was achieved with cautery. The edges of the cavity were marked with large clips, with one each medial, lateral, inferior and superior, and two clips posteriorly.   The specimen was inked with the margin marker paint kit.    Specimen radiography confirmed inclusion of the mammographic lesion, the clip, and the seed.  The background signal in the breast was zero.  The mammogram had all the  lesions, but one of the seeds was very close to the medial and inferior aspect of the specimen, so additional medial and inferior margins were taken.  Using a hand-held gamma probe, left axillary sentinel nodes were identified transcutaneously, though signal was weak.  Dissection was carried through the clavipectoral fascia.  Two level 2 deep axillary sentinel nodes were removed.  Counts per second were 15 and 5.    The background count was 0 cps.  The wound was irrigated.  Hemostasis was achieved with cautery.  The axillary incision was closed with a 3-0 vicryl deep dermal interrupted sutures and a 4-0 monocryl subcuticular closure.    Sterile dressings were applied. At the end of the operation, all sponge, instrument, and needle counts were correct.  Findings: grossly clear surgical margins and no adenopathy, weak mapping.  Posterior margin is pectoralis and anterior margin is skin.    Estimated Blood Loss:  min         Specimens: left breast lumpectomy with two seeds and two left axillary sentinel lymph nodes.             Complications:  None; patient tolerated the procedure well.         Disposition: PACU - hemodynamically stable.         Condition: stable

## 2019-08-01 NOTE — Interval H&P Note (Signed)
History and Physical Interval Note:  08/01/2019 7:44 AM  [name]  has presented today for surgery, with the diagnosis of LEFT BREAST CANCER.  The various methods of treatment have been discussed with the patient and family. After consideration of risks, benefits and other options for treatment, the patient has consented to  Procedure(s) with comments: LEFT BREAST LUMPECTOMY WITH RADIOACTIVE SEED AND SENTINEL LYMPH NODE BIOPSY (Left) - COMBINE WITH REGIONAL FOR POST OP PAIN as a surgical intervention.  The patient's history has been reviewed, patient examined, no change in status, stable for surgery.  I have reviewed the patient's chart and labs.  Questions were answered to the patient's satisfaction.     Stark Klein

## 2019-08-01 NOTE — Anesthesia Procedure Notes (Signed)
Procedure Name: LMA Insertion Date/Time: 08/01/2019 7:57 AM Performed by: Michele Rockers, CRNA Pre-anesthesia Checklist: Patient identified, Emergency Drugs available, Suction available and Patient being monitored Patient Re-evaluated:Patient Re-evaluated prior to induction Oxygen Delivery Method: Circle system utilized Preoxygenation: Pre-oxygenation with 100% oxygen Induction Type: IV induction Ventilation: Mask ventilation without difficulty LMA Size: 4.0 Tube type: Oral Number of attempts: 1 Airway Equipment and Method: Oral airway Placement Confirmation: ETT inserted through vocal cords under direct vision,  positive ETCO2 and breath sounds checked- equal and bilateral Tube secured with: Tape Dental Injury: Teeth and Oropharynx as per pre-operative assessment

## 2019-08-01 NOTE — Discharge Instructions (Addendum)
[facility] °Office Phone Number [phone] ° °BREAST BIOPSY/ PARTIAL MASTECTOMY: POST OP INSTRUCTIONS ° °Always review your discharge instruction sheet given to you by the facility where your surgery was performed. ° °IF YOU HAVE DISABILITY OR FAMILY LEAVE FORMS, YOU MUST BRING THEM TO THE OFFICE FOR PROCESSING.  DO NOT GIVE THEM TO YOUR DOCTOR. ° °1. A prescription for pain medication may be given to you upon discharge.  Take your pain medication as prescribed, if needed.  If narcotic pain medicine is not needed, then you may take acetaminophen (Tylenol) or ibuprofen (Advil) as needed. °2. Take your usually prescribed medications unless otherwise directed °3. If you need a refill on your pain medication, please contact your pharmacy.  They will contact our office to request authorization.  Prescriptions will not be filled after 5pm or on week-ends. °4. You should eat very light the first 24 hours after surgery, such as soup, crackers, pudding, etc.  Resume your normal diet the day after surgery. °5. Most patients will experience some swelling and bruising in the breast.  Ice packs and a good support bra will help.  Swelling and bruising can take several days to resolve.  °6. It is common to experience some constipation if taking pain medication after surgery.  Increasing fluid intake and taking a stool softener will usually help or prevent this problem from occurring.  A mild laxative (Milk of Magnesia or Miralax) should be taken according to package directions if there are no bowel movements after 48 hours. °7. Unless discharge instructions indicate otherwise, you may remove your bandages 48 hours after surgery, and you may shower at that time.  You may have steri-strips (small skin tapes) in place directly over the incision.  These strips should be left on the skin for 7-10 days.   Any sutures or staples will be removed at the office during your follow-up visit. °8. ACTIVITIES:  You may resume  regular daily activities (gradually increasing) beginning the next day.  Wearing a good support bra or sports bra (or the breast binder) minimizes pain and swelling.  You may have sexual intercourse when it is comfortable. °a. You may drive when you no longer are taking prescription pain medication, you can comfortably wear a seatbelt, and you can safely maneuver your car and apply brakes. °b. RETURN TO WORK:  __________1 week_______________ °9. You should see your doctor in the office for a follow-up appointment approximately two weeks after your surgery.  Your doctor’s nurse will typically make your follow-up appointment when she calls you with your pathology report.  Expect your pathology report 2-3 business days after your surgery.  You may call to check if you do not hear from us after three days. ° ° °WHEN TO CALL YOUR DOCTOR: °1. Fever over 101.0 °2. Nausea and/or vomiting. °3. Extreme swelling or bruising. °4. Continued bleeding from incision. °5. Increased pain, redness, or drainage from the incision. ° °The clinic staff is available to answer your questions during regular business hours.  Please don’t hesitate to call and ask to speak to one of the nurses for clinical concerns.  If you have a medical emergency, go to the nearest emergency room or call 911.  A surgeon from [facility] is always on call at the hospital. ° °For further questions, please visit [email or web address]  ° °

## 2019-08-01 NOTE — Anesthesia Procedure Notes (Signed)
Anesthesia Regional Block: Pectoralis block   Pre-Anesthetic Checklist: ,, timeout performed, Correct Patient, Correct Site, Correct Laterality, Correct Procedure, Correct Position, site marked, Risks and benefits discussed,  Surgical consent,  Pre-op evaluation,  At surgeon's request and post-op pain management  Laterality: Left  Prep: chloraprep       Needles:  Injection technique: Single-shot  Needle Type: Echogenic Needle     Needle Length: 9cm  Needle Gauge: 21     Additional Needles:   Procedures:,,,, ultrasound used (permanent image in chart),,,,  Narrative:  Start time: 08/01/2019 7:08 AM End time: 08/01/2019 7:20 AM Injection made incrementally with aspirations every 5 mL.  Performed by: Personally  Anesthesiologist: Barnet Glasgow, MD  Additional Notes: Block assessed. Patient tolerated procedure well.

## 2019-08-01 NOTE — Transfer of Care (Signed)
Immediate Anesthesia Transfer of Care Note  Patient: [name]  Procedure(s) Performed: LEFT BREAST LUMPECTOMY WITH RADIOACTIVE SEED AND SENTINEL LYMPH NODE BIOPSY (Left Breast)  Patient Location: PACU  Anesthesia Type:General  Level of Consciousness: drowsy, patient cooperative and responds to stimulation  Airway & Oxygen Therapy: Patient Spontanous Breathing and Patient connected to nasal cannula oxygen  Post-op Assessment: Report given to RN and Post -op Vital signs reviewed and stable  Post vital signs: Reviewed and stable  Last Vitals:  Vitals Value Taken Time  BP 110/67 08/01/19 0935  Temp    Pulse 65 08/01/19 0935  Resp 12 08/01/19 0935  SpO2 100 % 08/01/19 0935  Vitals shown include unvalidated device data.  Last Pain:  Vitals:   08/01/19 0559  PainSc: 0-No pain      Patients Stated Pain Goal: 1 (42/59/56 [id number])  Complications: No complications documented.

## 2019-08-01 NOTE — Anesthesia Postprocedure Evaluation (Signed)
Anesthesia Post Note  Patient: [name]  Procedure(s) Performed: LEFT BREAST LUMPECTOMY WITH RADIOACTIVE SEED AND SENTINEL LYMPH NODE BIOPSY (Left Breast)     Patient location during evaluation: PACU Anesthesia Type: General Level of consciousness: awake and alert Pain management: pain level controlled Vital Signs Assessment: post-procedure vital signs reviewed and stable Respiratory status: spontaneous breathing, nonlabored ventilation, respiratory function stable and patient connected to nasal cannula oxygen Cardiovascular status: blood pressure returned to baseline and stable Postop Assessment: no apparent nausea or vomiting Anesthetic complications: no   No complications documented.  Last Vitals:  Vitals:   08/01/19 0935 08/01/19 0940  BP: 110/67   Pulse: 65 71  Resp: 12 17  Temp: (!) 36.2 C   SpO2: 100% 100%    Last Pain:  Vitals:   08/01/19 0950  PainSc: 0-No pain                 Barnet Glasgow

## 2019-08-02 ENCOUNTER — Encounter (HOSPITAL_COMMUNITY): Payer: Self-pay | Admitting: General Surgery

## 2019-08-02 ENCOUNTER — Encounter: Payer: Self-pay | Admitting: Dietician

## 2019-08-02 NOTE — Progress Notes (Signed)
Nutrition  Patient identified after attending Breast Clinic on 07/16/2019. Patient given nutrition packet with RD contact information by nurse navigator at that time.  Chart reviewed.   Patient with DCIS of right breast. Plans for breast conserving surgery, adjuvant radiation, followed by antiestrogen therapy with Tamoxifen for 5 years.  Patient is not currently at nutrition risk. Please consult RD if nutrition issues arise.  Lajuan Lines, RD, LDN Clinical Nutrition After Hours/Weekend Pager # in [place]

## 2019-08-05 LAB — SURGICAL PATHOLOGY

## 2019-08-07 ENCOUNTER — Encounter: Payer: Self-pay | Admitting: *Deleted

## 2019-08-08 ENCOUNTER — Encounter: Payer: Self-pay | Admitting: Licensed Clinical Social Worker

## 2019-08-08 ENCOUNTER — Other Ambulatory Visit: Payer: Self-pay

## 2019-08-08 ENCOUNTER — Inpatient Hospital Stay
Payer: Federal, State, Local not specified - PPO | Attending: Hematology and Oncology | Admitting: Hematology and Oncology

## 2019-08-08 DIAGNOSIS — Z170 Estrogen receptor positive status [ER+]: Secondary | ICD-10-CM | POA: Diagnosis not present

## 2019-08-08 DIAGNOSIS — D0512 Intraductal carcinoma in situ of left breast: Secondary | ICD-10-CM | POA: Diagnosis not present

## 2019-08-08 DIAGNOSIS — Z79899 Other long term (current) drug therapy: Secondary | ICD-10-CM | POA: Diagnosis not present

## 2019-08-08 NOTE — Progress Notes (Signed)
Patient Care Team: Maurice Small, MD as PCP - General (Family Medicine) Mauro Kaufmann, RN as Oncology Nurse Navigator Rockwell Germany, RN as Oncology Nurse Navigator Stark Klein, MD as Consulting Physician (General Surgery) Nicholas Lose, MD as Consulting Physician (Hematology and Oncology) Kyung Rudd, MD as Consulting Physician (Radiation Oncology)  DIAGNOSIS:    ICD-10-CM   1. Ductal carcinoma in situ (DCIS) of left breast  D05.12     SUMMARY OF ONCOLOGIC HISTORY: Oncology History  Ductal carcinoma in situ (DCIS) of left breast  07/11/2019 Initial Diagnosis   Screening mammogram showed indeterminate left breast calcifications, 2.0cm, 12:30 position. Biopsy showed DCIS, grade 2, ER+ 95%, PR+ 20%.    08/01/2019 Surgery   Left lumpectomy: HG DCIS with necrosis margins neg, 0/2 LN neg ER 95%, PR 20%     CHIEF COMPLIANT: Follow-up s/p lumpectomy to review pathology  INTERVAL HISTORY: [name] is a 54 y.o. with above-mentioned history of left breast DCIS. She underwent a left lumpectomy and sentinel lymph node biopsy on 08/01/19 with Dr. Barry Dienes for which pathology showed high grade DCIS, clear margins, 2 left axillary lymph nodes negative for carcinoma. She presents to the clinic today to discuss the pathology report and further treatment. shes recovering very well from surgery.  ALLERGIES:  has No Known Allergies.  MEDICATIONS:  Current Outpatient Medications  Medication Sig Dispense Refill  . amLODipine (NORVASC) 5 MG tablet Take 5 mg by mouth daily.    Marland Kitchen ascorbic acid (VITAMIN C) 500 MG tablet Take 500 mg by mouth daily.    Marland Kitchen CALCIUM PO Take 1,200 mg by mouth daily.     Marland Kitchen FLUoxetine (PROZAC) 40 MG capsule Take 40 mg by mouth daily.    . hydrochlorothiazide (HYDRODIURIL) 12.5 MG tablet Take 12.5 mg by mouth daily.    Marland Kitchen levonorgestrel (MIRENA) 20 MCG/24HR IUD 1 each by Intrauterine route once. Inserted 2017    . Multiple Vitamin (MULTIVITAMIN WITH MINERALS) TABS tablet  Take 1 tablet by mouth daily.    Marland Kitchen oxyCODONE (OXY IR/ROXICODONE) 5 MG immediate release tablet Take 1 tablet (5 mg total) by mouth every 6 (six) hours as needed for severe pain. 20 tablet 0  . triamcinolone cream (KENALOG) 0.1 % Apply 1 application topically 2 (two) times daily as needed (rash).      No current facility-administered medications for this visit.    PHYSICAL EXAMINATION: ECOG PERFORMANCE STATUS: 1 - Symptomatic but completely ambulatory  Vitals:   08/08/19 1203  BP: 118/76  Pulse: 77  Resp: 18  Temp: 99.1 F (37.3 C)  SpO2: 100%   Filed Weights   08/08/19 1203  Weight: 178 lb 11.2 oz (81.1 kg)    LABORATORY DATA:  I have reviewed the data as listed CMP Latest Ref Rng & Units 07/24/2019 07/16/2019 12/25/2018  Glucose 70 - 99 mg/dL 90 98 129(H)  BUN 6 - 20 mg/dL 19 17 14   Creatinine 0.44 - 1.00 mg/dL 0.81 0.86 0.65  Sodium 135 - 145 mmol/L 137 137 135  Potassium 3.5 - 5.1 mmol/L 4.4 4.6 3.9  Chloride 98 - 111 mmol/L 101 106 102  CO2 22 - 32 mmol/L 27 24 23   Calcium 8.9 - 10.3 mg/dL 9.4 9.4 8.6(L)  Total Protein 6.5 - 8.1 g/dL - 7.4 7.1  Total Bilirubin 0.3 - 1.2 mg/dL - 0.4 0.6  Alkaline Phos 38 - 126 U/L - 70 65  AST 15 - 41 U/L - 24 29  ALT 0 - 44  U/L - 28 34    Lab Results  Component Value Date   WBC 8.9 07/24/2019   HGB 13.8 07/24/2019   HCT 41.5 07/24/2019   MCV 87.0 07/24/2019   PLT 416 (H) 07/24/2019   NEUTROABS 2.8 07/16/2019    ASSESSMENT & PLAN:  Ductal carcinoma in situ (DCIS) of left breast 07/11/2019:Screening mammogram showed indeterminate left breast calcifications, 2.0cm, 12:30 position. Biopsy showed DCIS with suspicion of microinvasion, grade 2, ER+ 95%, PR+ 20%.   Recommendation: 1. Breast conserving surgery 08/01/19: Left lumpectomy: HG DCIS with necrosis margins neg, 0/2 LN neg ER 95%, PR 20% 2. Followed by adjuvant radiation therapy 3. Followed by antiestrogen therapy with tamoxifen 5 years  RTC after RT       No orders  of the defined types were placed in this encounter.  The patient has a good understanding of the overall plan. she agrees with it. she will call with any problems that may develop before the next visit here.  Total time spent: 30 mins including face to face time and time spent for planning, charting and coordination of care  Nicholas Lose, MD 08/08/2019  I, Cloyde Reams Dorshimer, am acting as scribe for Dr. Nicholas Lose.  I have reviewed the above documentation for accuracy and completeness, and I agree with the above.

## 2019-08-08 NOTE — Assessment & Plan Note (Signed)
07/11/2019:Screening mammogram showed indeterminate left breast calcifications, 2.0cm, 12:30 position. Biopsy showed DCIS with suspicion of microinvasion, grade 2, ER+ 95%, PR+ 20%.   Recommendation: 1. Breast conserving surgery 08/01/19: Left lumpectomy: HG DCIS with necrosis margins neg, 0/2 LN neg ER 95%, PR 20% 2. Followed by adjuvant radiation therapy 3. Followed by antiestrogen therapy with tamoxifen 5 years  RTC after RT

## 2019-08-08 NOTE — Progress Notes (Signed)
[place] CSW Progress Note  Holiday representative met with patient to provide ongoing adjustment support. Patient had lumpectomy last week (7/9). She is doing well recovering physically and anxiety level is significantly decreased. She was able to start walking again and feels more like herself. No concerns or needs today.  CSW will continue to follow periodically during treatment.   Edwinna Areola Tymira Horkey , LCSW

## 2019-08-11 ENCOUNTER — Telehealth: Payer: Self-pay | Admitting: Hematology and Oncology

## 2019-08-11 NOTE — Telephone Encounter (Signed)
Per 7/16 los, no changes made to pt schedule   

## 2019-08-25 ENCOUNTER — Other Ambulatory Visit: Payer: Self-pay

## 2019-08-25 ENCOUNTER — Encounter: Payer: Federal, State, Local not specified - PPO | Attending: General Surgery | Admitting: Skilled Nursing Facility1

## 2019-08-25 DIAGNOSIS — E669 Obesity, unspecified: Secondary | ICD-10-CM

## 2019-08-25 NOTE — Progress Notes (Signed)
Bariatric Nutrition Follow-Up Visit Medical Nutrition Therapy   Post-Operative sleeve gastrecomy Surgery Surgery Date: 12/24/2018   NUTRITION ASSESSMENT    Anthropometrics  Start weight at [facility]: 218.3 lbs (date: 09/12/2017) Today's weight: 172  Body Composition Scale Date 03/17/19 05/26/2019  Weight  lbs 188.3 178.5 172  Total Body Fat  % 38.3 35.5 34.3     Visceral Fat 11 9 9   Fat-Free Mass  % 61.6 64.4 65.6     Total Body Water  % 45.3 46.7 47.3     Muscle-Mass  lbs 30.4 31.2 31.1  BMI 32.1 29.3 28.3  Body Fat Displacement ---          Torso  lbs 44.6 39.2 36.4        Left Leg  lbs 8.9 7.8 7.2        Right Leg  lbs 8.9 7.8 7.2        Left Arm  lbs 4.4 3.9 3.6        Right Arm  lbs 4.4 3.9 3.6   Clinical  Medical hx: diabetes, HTN Medications: no longer taking metformin  Labs:    Lifestyle & Dietary Hx  Pt is sailing right along with no issues reported.  Pt states her family telling her she has a lot energy.   Pt states she is at an average of 1000 calories throughout the week.   Pt states she like she weight she is at but wants to lose 5-10 pounds. Pt states she was just diagnosed with breast cancer a couple months ago and had a lumpectomy last month will start radiation and tomoxifen. Pt is in good spirits feeling well.   Estimated daily fluid intake: 64 oz Estimated daily protein intake: 60+ g Supplements: celebrate and calcium  Current average weekly physical activity: resistance bands 3 times a week and walking 5 days a week 120 minutes   24-Hr Dietary Recall First Meal: 2 [place] sausage patties or 2 eggs or yogurt dannon light and fit or too good with fruit or peanut butter on sandwich thins with half banana or oatmeal  Snack: cheese  Second Meal: tuna packet with broccoli  on half sandwich thin or spinach or tomatoes and okra or grilled chicken salad with brown rice with mushrooms or edamame  Snack: sugar free jello or pudding or cheese stick  Third Meal:  tuna packet with broccoli or spinach or tomatoes and okra or grilled chicken salad  Snack:  Beverages: water, crystal light   Post-Op Goals/ Signs/ Symptoms Using straws: no Drinking while eating: no Chewing/swallowing difficulties: no Changes in vision: no Changes to mood/headaches: no Hair loss/changes to skin/nails: no Difficulty focusing/concentrating: no Sweating: no Dizziness/lightheadedness: no Palpitations: no  Carbonated/caffeinated beverages: no N/V/D/C/Gas: no Abdominal pain: no Dumping syndrome: no    NUTRITION DIAGNOSIS  Overweight/obesity ([place]-3.3) related to past poor dietary habits and physical inactivity as evidenced by completed bariatric surgery and following dietary guidelines for continued weight loss and healthy nutrition status.     NUTRITION INTERVENTION Nutrition counseling (C-1) and education (E-2) to facilitate bariatric surgery goals, including: . The importance of consuming adequate calories as well as certain nutrients daily due to the body's need for essential vitamins, minerals, and fats . The importance of daily physical activity and to reach a goal of at least 150 minutes of moderate to vigorous physical activity weekly (or as directed by their physician) due to benefits such as increased musculature and improved lab values . Why you need complex  carbohydrates: Whole grains and other complex carbohydrates are required to have a healthy diet. Whole grains provide fiber which can help with blood glucose levels and help keep you satiated. Fruits and starchy vegetables provide essential vitamins and minerals required for immune function, eyesight support, brain support, bone density, wound healing and many other functions within the body. According to the current evidenced based 2015-2020 Dietary Guidelines for Americans, complex carbohydrates are part of a healthy eating pattern which is associated with a decreased risk for type 2 diabetes, cancers, and  cardiovascular disease.   Goals: Lean on plant based proteins  Aim for 1-2 servings of soy per day  Handouts Provided Include     Learning Style & Readiness for Change Teaching method utilized: Visual & Auditory  Demonstrated degree of understanding via: Teach Back  Barriers to learning/adherence to lifestyle change: none identified   RD's Notes for Next Visit . Assess adherence to pt chosen goals   MONITORING & EVALUATION Dietary intake, weekly physical activity, body weight  Next Steps Patient is to follow-up in August

## 2019-08-25 NOTE — Progress Notes (Signed)
Location of Breast Cancer: [facility] left breast  Did patient present with symptoms (if so, please note symptoms) or was this found on screening mammography?: Screening mammogram.  Diagnostic Mammogram 06/25/2019: 2 cm grouped amorphous calcifications in the left breast are indeterminate.  A stereotactic biopsy is recommended to exclude DCIS.  Histology per Pathology Report: Left Lumpectomy 08/01/2019   Left Breast Biopsy 07/09/2019  Receptor Status: ER(95% +), PR (20% +), Her2-neu (), Ki-()    Past/Anticipated interventions by surgeon, if any: Dr. Barry Dienes -Left Breast Lumpectomy with SLN 08/01/2019 -2/2 nodes negative -Clear margins   Past/Anticipated interventions by medical oncology, if any: Chemotherapy  Dr. Lindi Adie 08/08/2019 -Ductal carcinoma in situ (DCIS) of left breast 07/11/2019:Screening mammogram showed indeterminate left breast calcifications, 2.0cm, 12:30 position. Biopsy showed DCISwith suspicion of microinvasion, grade 2, ER+ 95%, PR+ 20%.  Recommendation: 1. Breast conserving surgery 08/01/19: Left lumpectomy: HG DCIS with necrosis margins neg, 0/2 LN neg ER 95%, PR 20% 2. Followed by adjuvant radiation therapy 3. Followed by antiestrogen therapy with tamoxifen 5 years   Lymphedema issues, if any:  Feels a little pocket of fluid under her arm, continues to have some tenderness.  Pain issues, if any:  Tenderness under her arm and in her breast.  SAFETY ISSUES:  Prior radiation? no  Pacemaker/ICD? No  Possible current pregnancy? IUD  Is the patient on methotrexate? No  Current Complaints / other details:      Cori Razor, RN 08/25/2019,9:09 AM

## 2019-08-26 ENCOUNTER — Other Ambulatory Visit: Payer: Self-pay

## 2019-08-26 ENCOUNTER — Encounter: Payer: Self-pay | Admitting: Radiation Oncology

## 2019-08-26 ENCOUNTER — Ambulatory Visit
Admission: RE | Admit: 2019-08-26 | Discharge: 2019-08-26 | Disposition: A | Discharge status: Home / Self Care | Payer: Federal, State, Local not specified - PPO | Source: Ambulatory Visit | Attending: Radiation Oncology | Admitting: Radiation Oncology

## 2019-08-26 VITALS — BP 118/69 | HR 72 | Temp 98.6°F | Resp 18 | Ht 65.0 in | Wt 177.0 lb

## 2019-08-26 DIAGNOSIS — I10 Essential (primary) hypertension: Secondary | ICD-10-CM | POA: Diagnosis not present

## 2019-08-26 DIAGNOSIS — Z79899 Other long term (current) drug therapy: Secondary | ICD-10-CM | POA: Insufficient documentation

## 2019-08-26 DIAGNOSIS — E7800 Pure hypercholesterolemia, unspecified: Secondary | ICD-10-CM | POA: Diagnosis not present

## 2019-08-26 DIAGNOSIS — E119 Type 2 diabetes mellitus without complications: Secondary | ICD-10-CM | POA: Insufficient documentation

## 2019-08-26 DIAGNOSIS — Z170 Estrogen receptor positive status [ER+]: Secondary | ICD-10-CM | POA: Insufficient documentation

## 2019-08-26 DIAGNOSIS — D0512 Intraductal carcinoma in situ of left breast: Secondary | ICD-10-CM | POA: Insufficient documentation

## 2019-08-26 DIAGNOSIS — Z9889 Other specified postprocedural states: Secondary | ICD-10-CM | POA: Diagnosis not present

## 2019-08-26 NOTE — Progress Notes (Signed)
Radiation Oncology         [phone] ________________________________  Name: [name]        MRN: [id number]  Date of Service: 08/26/2019 DOB: 09-Dec-1965  HQ:PRFF, Arbie Cookey, MD  Nicholas Lose, MD     REFERRING PHYSICIAN: Nicholas Lose, MD   DIAGNOSIS: The encounter diagnosis was Ductal carcinoma in situ (DCIS) of left breast.   HISTORY OF PRESENT ILLNESS: [name] is a 54 y.o. female seen at the request of Dr. Barry Dienes for a new diagnosis of left breast cancer.  The patient was found to have screening detected calcifications in the upper outer quadrant of the left breast.  On diagnostic imaging these measured a total of 2 cm.  And a biopsy on 07/09/2019 revealed intermediate grade DCIS that was ER/PR positive.  She subsequently underwent left lumpectomy on 08/01/2019, her lumpectomy specimen on the left revealed high-grade DCIS with necrosis, her disease was less than 1 mm from the anterior margin focally, 1 to 2 mm from the medial posterior margin focally, and less than 1 mm from the posterior margin focally, atypical lobular hyperplasia was identified, and additional medial excision revealed fibrocystic and fibroadenomatoid change, inferior margin additional tissue was consistent with intraductal papilloma and fibrocystic change.  She did have 2 sentinel nodes removed that were both negative for disease.   Her skin and pectoralis are the borders for margins so no additional surgery is planned.  She is seen today to discuss treatment options for her cancer in the adjuvant setting.    PREVIOUS RADIATION THERAPY: No   PAST MEDICAL HISTORY:  Past Medical History:  Diagnosis Date  . Complication of anesthesia 1997   facial swelling postop due to anesthesia per pt cyst removal from head  . Diabetes mellitus without complication (HCC)    type 2, diet controlled  . High cholesterol   . Hypertension   . Lumbar disc disorder    lower lumbar       PAST SURGICAL HISTORY: Past  Surgical History:  Procedure Laterality Date  . BREAST LUMPECTOMY WITH RADIOACTIVE SEED AND SENTINEL LYMPH NODE BIOPSY Left 08/01/2019   Procedure: LEFT BREAST LUMPECTOMY WITH RADIOACTIVE SEED AND SENTINEL LYMPH NODE BIOPSY;  Surgeon: Stark Klein, MD;  Location: [facility];  Service: General;  Laterality: Left;  COMBINE WITH REGIONAL FOR POST OP PAIN  . BREAST SURGERY Bilateral 2019 may   reduction  . BREAST SURGERY Right    for an infection  . CHOLECYSTECTOMY    . CYST EXCISION  1997   from scalp  . DILATION AND CURETTAGE OF UTERUS    . hysterscopy  2014  . LAPAROSCOPIC GASTRIC SLEEVE RESECTION N/A 12/24/2018   Procedure: LAPAROSCOPIC GASTRIC SLEEVE RESECTION, Upper Endo, ERAS Pathway;  Surgeon: Greer Pickerel, MD;  Location: [facility];  Service: General;  Laterality: N/A;     FAMILY HISTORY:  Family History  Problem Relation Age of Onset  . Diabetes Mother   . Hypertension Mother   . Diabetes Father   . Hypertension Father      SOCIAL HISTORY:  reports that she has never smoked. She has never used smokeless tobacco. She reports previous alcohol use. She reports that she does not use drugs.  The patient is divorced and lives in [place]. 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  ALLERGIES: Patient has no known allergies.   MEDICATIONS:  Current Outpatient Medications  Medication Sig Dispense Refill  . amLODipine (NORVASC) 5 MG  tablet Take 5 mg by mouth daily.    Marland Kitchen ascorbic acid (VITAMIN C) 500 MG tablet Take 500 mg by mouth daily.    Marland Kitchen CALCIUM PO Take 1,200 mg by mouth daily.     Marland Kitchen FLUoxetine (PROZAC) 40 MG capsule Take 40 mg by mouth daily.    . hydrochlorothiazide (HYDRODIURIL) 12.5 MG tablet Take 12.5 mg by mouth daily.    Marland Kitchen levonorgestrel (MIRENA) 20 MCG/24HR IUD 1 each by Intrauterine route once. Inserted 2017    . Multiple Vitamin (MULTIVITAMIN WITH MINERALS) TABS tablet Take 1 tablet by mouth daily.    Marland Kitchen oxyCODONE (OXY IR/ROXICODONE) 5 MG immediate release  tablet Take 1 tablet (5 mg total) by mouth every 6 (six) hours as needed for severe pain. 20 tablet 0  . triamcinolone cream (KENALOG) 0.1 % Apply 1 application topically 2 (two) times daily as needed (rash).      No current facility-administered medications for this encounter.     REVIEW OF SYSTEMS: On review of systems, the patient reports that she is doing well overall. She is not having any significant problems since surgery with pain or healing, but has a small area of fluid she noticed in the breast. No other complaints are noted.    PHYSICAL EXAM:  Wt Readings from Last 3 Encounters:  08/08/19 178 lb 11.2 oz (81.1 kg)  08/01/19 178 lb (80.7 kg)  07/24/19 178 lb (80.7 kg)   Temp Readings from Last 3 Encounters:  08/08/19 99.1 F (37.3 C) (Temporal)  08/01/19 (!) 97.3 F (36.3 C)  07/24/19 97.8 F (36.6 C) (Oral)   BP Readings from Last 3 Encounters:  08/08/19 118/76  08/01/19 110/67  07/24/19 125/70   Pulse Readings from Last 3 Encounters:  08/08/19 77  08/01/19 71  07/24/19 68    In general this is a well appearing African American female in no acute distress. She's alert and oriented x4 and appropriate throughout the examination. Cardiopulmonary assessment is negative for acute distress and she exhibits normal effort. Exam of the left breast is well healed along her upper outer quadrant with minimal fluid palpable through the skin. No erythema is noted.    ECOG = 0  0 - Asymptomatic (Fully active, able to carry on all predisease activities without restriction)  1 - Symptomatic but completely ambulatory (Restricted in physically strenuous activity but ambulatory and able to carry out work of a light or sedentary nature. For example, light housework, office work)  2 - Symptomatic, <50% in bed during the day (Ambulatory and capable of all self care but unable to carry out any work activities. Up and about more than 50% of waking hours)  3 - Symptomatic, >50% in  bed, but not bedbound (Capable of only limited self-care, confined to bed or chair 50% or more of waking hours)  4 - Bedbound (Completely disabled. Cannot carry on any self-care. Totally confined to bed or chair)  5 - Death   Eustace Pen MM, Creech RH, Tormey DC, et al. [phone]). "Toxicity and response criteria of the [facility]". [place] Oncol. 5 (6): 649-55    LABORATORY DATA:  Lab Results  Component Value Date   WBC 8.9 07/24/2019   HGB 13.8 07/24/2019   HCT 41.5 07/24/2019   MCV 87.0 07/24/2019   PLT 416 (H) 07/24/2019   Lab Results  Component Value Date   NA 137 07/24/2019   K 4.4 07/24/2019   CL 101 07/24/2019   CO2 27  07/24/2019   Lab Results  Component Value Date   ALT 28 07/16/2019   AST 24 07/16/2019   ALKPHOS 70 07/16/2019   BILITOT 0.4 07/16/2019      RADIOGRAPHY: NM Sentinel Node Inj-No Rpt (Breast)  Result Date: 08/01/2019 Sulfur colloid was injected by the nuclear medicine technologist for melanoma sentinel node.       IMPRESSION/PLAN: 1. High Grade ER/PR positive DCIS of the left breast. Dr. Lisbeth Renshaw discusses the pathology findings and reviews the nature of noninvasive breast disease. Dr. Lisbeth Renshaw discusses the rationale for adjuvant external radiotherapy to the breast followed by antiestrogen therapy. We discussed the risks, benefits, short, and long term effects of radiotherapy, and the patient is interested in proceeding. Dr. Lisbeth Renshaw discusses the delivery and logistics of radiotherapy and recommends a course of 6 1/2 weeks of radiotherapy with deep inspiration breath hold technique. Written consent is obtained and placed in the chart, a copy was provided to the patient. She will return tomorrow for simulation. 2. Contraceptive Counseling. The patient is not cycling and has not for several years despite having an IUD. We discussed and do not feel that there is a need for pregnancy testing.   In a visit lasting 60 minutes, greater than 50%  of the time was spent face to face reviewing her case, as well as in preparation of, discussing, and coordinating the patient's care.  The above documentation reflects my direct findings during this shared patient visit. Please see the separate note by Dr. Lisbeth Renshaw on this date for the remainder of the patient's plan of care.    Carola Rhine, PAC

## 2019-08-26 NOTE — Addendum Note (Signed)
Encounter addended by: Cori Razor, RN on: 08/26/2019 11:45 AM  Actions taken: Flowsheet accepted

## 2019-08-27 ENCOUNTER — Ambulatory Visit
Admission: RE | Admit: 2019-08-27 | Discharge: 2019-08-27 | Disposition: A | Discharge status: Home / Self Care | Payer: Federal, State, Local not specified - PPO | Source: Ambulatory Visit | Attending: Radiation Oncology | Admitting: Radiation Oncology

## 2019-08-27 ENCOUNTER — Other Ambulatory Visit: Payer: Self-pay

## 2019-08-27 DIAGNOSIS — D0512 Intraductal carcinoma in situ of left breast: Secondary | ICD-10-CM | POA: Insufficient documentation

## 2019-08-27 DIAGNOSIS — Z510 Encounter for antineoplastic radiation therapy: Secondary | ICD-10-CM | POA: Diagnosis not present

## 2019-08-27 DIAGNOSIS — Z170 Estrogen receptor positive status [ER+]: Secondary | ICD-10-CM | POA: Insufficient documentation

## 2019-08-28 ENCOUNTER — Encounter: Payer: Self-pay | Admitting: *Deleted

## 2019-08-28 ENCOUNTER — Telehealth: Payer: Self-pay | Admitting: Hematology and Oncology

## 2019-08-28 NOTE — Telephone Encounter (Signed)
Scheduled appt per 8/5 sch msg - mailed reminder letter with appt date and time

## 2019-09-02 DIAGNOSIS — Z510 Encounter for antineoplastic radiation therapy: Secondary | ICD-10-CM | POA: Diagnosis not present

## 2019-09-02 DIAGNOSIS — Z170 Estrogen receptor positive status [ER+]: Secondary | ICD-10-CM | POA: Diagnosis not present

## 2019-09-02 DIAGNOSIS — D0512 Intraductal carcinoma in situ of left breast: Secondary | ICD-10-CM | POA: Diagnosis not present

## 2019-09-08 ENCOUNTER — Ambulatory Visit
Admission: RE | Admit: 2019-09-08 | Discharge: 2019-09-08 | Disposition: A | Discharge status: Home / Self Care | Payer: Federal, State, Local not specified - PPO | Source: Ambulatory Visit | Attending: Radiation Oncology | Admitting: Radiation Oncology

## 2019-09-08 ENCOUNTER — Other Ambulatory Visit: Payer: Self-pay

## 2019-09-08 DIAGNOSIS — Z170 Estrogen receptor positive status [ER+]: Secondary | ICD-10-CM | POA: Diagnosis not present

## 2019-09-08 DIAGNOSIS — D0512 Intraductal carcinoma in situ of left breast: Secondary | ICD-10-CM | POA: Diagnosis not present

## 2019-09-08 DIAGNOSIS — Z510 Encounter for antineoplastic radiation therapy: Secondary | ICD-10-CM | POA: Diagnosis not present

## 2019-09-09 ENCOUNTER — Other Ambulatory Visit: Payer: Self-pay

## 2019-09-09 ENCOUNTER — Ambulatory Visit
Admission: RE | Admit: 2019-09-09 | Discharge: 2019-09-09 | Disposition: A | Discharge status: Home / Self Care | Payer: Federal, State, Local not specified - PPO | Source: Ambulatory Visit | Attending: Radiation Oncology | Admitting: Radiation Oncology

## 2019-09-09 DIAGNOSIS — Z510 Encounter for antineoplastic radiation therapy: Secondary | ICD-10-CM | POA: Diagnosis not present

## 2019-09-09 DIAGNOSIS — Z170 Estrogen receptor positive status [ER+]: Secondary | ICD-10-CM | POA: Diagnosis not present

## 2019-09-09 DIAGNOSIS — D0512 Intraductal carcinoma in situ of left breast: Secondary | ICD-10-CM | POA: Diagnosis not present

## 2019-09-10 ENCOUNTER — Other Ambulatory Visit: Payer: Self-pay

## 2019-09-10 ENCOUNTER — Ambulatory Visit
Admission: RE | Admit: 2019-09-10 | Discharge: 2019-09-10 | Disposition: A | Discharge status: Home / Self Care | Payer: Federal, State, Local not specified - PPO | Source: Ambulatory Visit | Attending: Radiation Oncology | Admitting: Radiation Oncology

## 2019-09-10 DIAGNOSIS — Z170 Estrogen receptor positive status [ER+]: Secondary | ICD-10-CM | POA: Diagnosis not present

## 2019-09-10 DIAGNOSIS — Z510 Encounter for antineoplastic radiation therapy: Secondary | ICD-10-CM | POA: Diagnosis not present

## 2019-09-10 DIAGNOSIS — D0512 Intraductal carcinoma in situ of left breast: Secondary | ICD-10-CM | POA: Diagnosis not present

## 2019-09-11 ENCOUNTER — Ambulatory Visit
Admission: RE | Admit: 2019-09-11 | Discharge: 2019-09-11 | Disposition: A | Discharge status: Home / Self Care | Payer: Federal, State, Local not specified - PPO | Source: Ambulatory Visit | Attending: Radiation Oncology | Admitting: Radiation Oncology

## 2019-09-11 ENCOUNTER — Other Ambulatory Visit: Payer: Self-pay

## 2019-09-11 DIAGNOSIS — Z170 Estrogen receptor positive status [ER+]: Secondary | ICD-10-CM | POA: Diagnosis not present

## 2019-09-11 DIAGNOSIS — Z510 Encounter for antineoplastic radiation therapy: Secondary | ICD-10-CM | POA: Diagnosis not present

## 2019-09-11 DIAGNOSIS — D0512 Intraductal carcinoma in situ of left breast: Secondary | ICD-10-CM | POA: Diagnosis not present

## 2019-09-12 ENCOUNTER — Ambulatory Visit
Admission: RE | Admit: 2019-09-12 | Discharge: 2019-09-12 | Disposition: A | Discharge status: Home / Self Care | Payer: Federal, State, Local not specified - PPO | Source: Ambulatory Visit | Attending: Radiation Oncology | Admitting: Radiation Oncology

## 2019-09-12 DIAGNOSIS — D0512 Intraductal carcinoma in situ of left breast: Secondary | ICD-10-CM | POA: Diagnosis not present

## 2019-09-12 DIAGNOSIS — Z510 Encounter for antineoplastic radiation therapy: Secondary | ICD-10-CM | POA: Diagnosis not present

## 2019-09-12 DIAGNOSIS — Z170 Estrogen receptor positive status [ER+]: Secondary | ICD-10-CM | POA: Diagnosis not present

## 2019-09-12 MED ORDER — ALRA NON-METALLIC DEODORANT (RAD-ONC)
1.0000 "application " | Freq: Once | TOPICAL | Status: AC
  Administered 2019-09-12: 1 via TOPICAL

## 2019-09-12 MED ORDER — SONAFINE EX EMUL
1.0000 "application " | Freq: Once | CUTANEOUS | Status: AC
  Administered 2019-09-12: 1 via TOPICAL

## 2019-09-12 NOTE — Progress Notes (Signed)
Pt here for patient teaching.  Pt given Radiation and You booklet, skin care instructions, Alra deodorant and Sonafine.  Reviewed areas of pertinence such as fatigue, hair loss, skin changes, breast tenderness and breast swelling . Pt able to give teach back of to pat skin and use unscented/gentle soap,apply Sonafine bid, avoid applying anything to skin within 4 hours of treatment, avoid wearing an under wire bra and to use an electric razor if they must shave. Pt verbalizes understanding of information given and will contact nursing with any questions or concerns.     Marliyah Reid M. Victormanuel Mclure RN, BSN             

## 2019-09-15 ENCOUNTER — Other Ambulatory Visit: Payer: Self-pay

## 2019-09-15 ENCOUNTER — Ambulatory Visit
Admission: RE | Admit: 2019-09-15 | Discharge: 2019-09-15 | Disposition: A | Discharge status: Home / Self Care | Payer: Federal, State, Local not specified - PPO | Source: Ambulatory Visit | Attending: Radiation Oncology | Admitting: Radiation Oncology

## 2019-09-15 DIAGNOSIS — D0512 Intraductal carcinoma in situ of left breast: Secondary | ICD-10-CM | POA: Diagnosis not present

## 2019-09-15 DIAGNOSIS — Z510 Encounter for antineoplastic radiation therapy: Secondary | ICD-10-CM | POA: Diagnosis not present

## 2019-09-15 DIAGNOSIS — Z170 Estrogen receptor positive status [ER+]: Secondary | ICD-10-CM | POA: Diagnosis not present

## 2019-09-16 ENCOUNTER — Other Ambulatory Visit: Payer: Self-pay

## 2019-09-16 ENCOUNTER — Ambulatory Visit
Admission: RE | Admit: 2019-09-16 | Discharge: 2019-09-16 | Disposition: A | Discharge status: Home / Self Care | Payer: Federal, State, Local not specified - PPO | Source: Ambulatory Visit | Attending: Radiation Oncology | Admitting: Radiation Oncology

## 2019-09-16 DIAGNOSIS — Z510 Encounter for antineoplastic radiation therapy: Secondary | ICD-10-CM | POA: Diagnosis not present

## 2019-09-16 DIAGNOSIS — D0512 Intraductal carcinoma in situ of left breast: Secondary | ICD-10-CM | POA: Diagnosis not present

## 2019-09-16 DIAGNOSIS — Z170 Estrogen receptor positive status [ER+]: Secondary | ICD-10-CM | POA: Diagnosis not present

## 2019-09-17 ENCOUNTER — Other Ambulatory Visit: Payer: Self-pay

## 2019-09-17 ENCOUNTER — Ambulatory Visit
Admission: RE | Admit: 2019-09-17 | Discharge: 2019-09-17 | Disposition: A | Discharge status: Home / Self Care | Payer: Federal, State, Local not specified - PPO | Source: Ambulatory Visit | Attending: Radiation Oncology | Admitting: Radiation Oncology

## 2019-09-17 DIAGNOSIS — D0512 Intraductal carcinoma in situ of left breast: Secondary | ICD-10-CM | POA: Diagnosis not present

## 2019-09-17 DIAGNOSIS — Z510 Encounter for antineoplastic radiation therapy: Secondary | ICD-10-CM | POA: Diagnosis not present

## 2019-09-17 DIAGNOSIS — Z170 Estrogen receptor positive status [ER+]: Secondary | ICD-10-CM | POA: Diagnosis not present

## 2019-09-18 ENCOUNTER — Ambulatory Visit
Admission: RE | Admit: 2019-09-18 | Discharge: 2019-09-18 | Disposition: A | Discharge status: Home / Self Care | Payer: Federal, State, Local not specified - PPO | Source: Ambulatory Visit | Attending: Radiation Oncology | Admitting: Radiation Oncology

## 2019-09-18 ENCOUNTER — Other Ambulatory Visit: Payer: Self-pay

## 2019-09-18 DIAGNOSIS — Z170 Estrogen receptor positive status [ER+]: Secondary | ICD-10-CM | POA: Diagnosis not present

## 2019-09-18 DIAGNOSIS — Z510 Encounter for antineoplastic radiation therapy: Secondary | ICD-10-CM | POA: Diagnosis not present

## 2019-09-18 DIAGNOSIS — D0512 Intraductal carcinoma in situ of left breast: Secondary | ICD-10-CM | POA: Diagnosis not present

## 2019-09-19 ENCOUNTER — Ambulatory Visit
Admission: RE | Admit: 2019-09-19 | Discharge: 2019-09-19 | Disposition: A | Discharge status: Home / Self Care | Payer: Federal, State, Local not specified - PPO | Source: Ambulatory Visit | Attending: Radiation Oncology | Admitting: Radiation Oncology

## 2019-09-19 DIAGNOSIS — Z170 Estrogen receptor positive status [ER+]: Secondary | ICD-10-CM | POA: Diagnosis not present

## 2019-09-19 DIAGNOSIS — D0512 Intraductal carcinoma in situ of left breast: Secondary | ICD-10-CM | POA: Diagnosis not present

## 2019-09-19 DIAGNOSIS — Z510 Encounter for antineoplastic radiation therapy: Secondary | ICD-10-CM | POA: Diagnosis not present

## 2019-09-22 ENCOUNTER — Other Ambulatory Visit: Payer: Self-pay

## 2019-09-22 ENCOUNTER — Ambulatory Visit
Admission: RE | Admit: 2019-09-22 | Discharge: 2019-09-22 | Disposition: A | Discharge status: Home / Self Care | Payer: Federal, State, Local not specified - PPO | Source: Ambulatory Visit | Attending: Radiation Oncology | Admitting: Radiation Oncology

## 2019-09-22 DIAGNOSIS — Z170 Estrogen receptor positive status [ER+]: Secondary | ICD-10-CM | POA: Diagnosis not present

## 2019-09-22 DIAGNOSIS — D0512 Intraductal carcinoma in situ of left breast: Secondary | ICD-10-CM | POA: Diagnosis not present

## 2019-09-22 DIAGNOSIS — Z510 Encounter for antineoplastic radiation therapy: Secondary | ICD-10-CM | POA: Diagnosis not present

## 2019-09-23 ENCOUNTER — Ambulatory Visit
Admission: RE | Admit: 2019-09-23 | Discharge: 2019-09-23 | Disposition: A | Discharge status: Home / Self Care | Payer: Federal, State, Local not specified - PPO | Source: Ambulatory Visit | Attending: Radiation Oncology | Admitting: Radiation Oncology

## 2019-09-23 ENCOUNTER — Other Ambulatory Visit: Payer: Self-pay

## 2019-09-23 DIAGNOSIS — Z510 Encounter for antineoplastic radiation therapy: Secondary | ICD-10-CM | POA: Diagnosis not present

## 2019-09-23 DIAGNOSIS — D0512 Intraductal carcinoma in situ of left breast: Secondary | ICD-10-CM | POA: Diagnosis not present

## 2019-09-23 DIAGNOSIS — Z170 Estrogen receptor positive status [ER+]: Secondary | ICD-10-CM | POA: Diagnosis not present

## 2019-09-24 ENCOUNTER — Other Ambulatory Visit: Payer: Self-pay

## 2019-09-24 ENCOUNTER — Ambulatory Visit
Admission: RE | Admit: 2019-09-24 | Discharge: 2019-09-24 | Disposition: A | Discharge status: Home / Self Care | Payer: Federal, State, Local not specified - PPO | Source: Ambulatory Visit | Attending: Radiation Oncology | Admitting: Radiation Oncology

## 2019-09-24 DIAGNOSIS — Z170 Estrogen receptor positive status [ER+]: Secondary | ICD-10-CM | POA: Diagnosis not present

## 2019-09-24 DIAGNOSIS — D0512 Intraductal carcinoma in situ of left breast: Secondary | ICD-10-CM | POA: Insufficient documentation

## 2019-09-24 DIAGNOSIS — Z510 Encounter for antineoplastic radiation therapy: Secondary | ICD-10-CM | POA: Insufficient documentation

## 2019-09-25 ENCOUNTER — Ambulatory Visit
Admission: RE | Admit: 2019-09-25 | Discharge: 2019-09-25 | Disposition: A | Discharge status: Home / Self Care | Payer: Federal, State, Local not specified - PPO | Source: Ambulatory Visit | Attending: Radiation Oncology | Admitting: Radiation Oncology

## 2019-09-25 DIAGNOSIS — Z170 Estrogen receptor positive status [ER+]: Secondary | ICD-10-CM | POA: Diagnosis not present

## 2019-09-25 DIAGNOSIS — Z510 Encounter for antineoplastic radiation therapy: Secondary | ICD-10-CM | POA: Diagnosis not present

## 2019-09-25 DIAGNOSIS — D0512 Intraductal carcinoma in situ of left breast: Secondary | ICD-10-CM | POA: Diagnosis not present

## 2019-09-26 ENCOUNTER — Ambulatory Visit
Admission: RE | Admit: 2019-09-26 | Discharge: 2019-09-26 | Disposition: A | Discharge status: Home / Self Care | Payer: Federal, State, Local not specified - PPO | Source: Ambulatory Visit | Attending: Radiation Oncology | Admitting: Radiation Oncology

## 2019-09-26 ENCOUNTER — Other Ambulatory Visit: Payer: Self-pay

## 2019-09-26 DIAGNOSIS — Z170 Estrogen receptor positive status [ER+]: Secondary | ICD-10-CM | POA: Diagnosis not present

## 2019-09-26 DIAGNOSIS — D0512 Intraductal carcinoma in situ of left breast: Secondary | ICD-10-CM | POA: Diagnosis not present

## 2019-09-26 DIAGNOSIS — Z510 Encounter for antineoplastic radiation therapy: Secondary | ICD-10-CM | POA: Diagnosis not present

## 2019-09-30 ENCOUNTER — Other Ambulatory Visit: Payer: Self-pay

## 2019-09-30 ENCOUNTER — Ambulatory Visit
Admission: RE | Admit: 2019-09-30 | Discharge: 2019-09-30 | Disposition: A | Discharge status: Home / Self Care | Payer: Federal, State, Local not specified - PPO | Source: Ambulatory Visit | Attending: Radiation Oncology | Admitting: Radiation Oncology

## 2019-09-30 DIAGNOSIS — Z170 Estrogen receptor positive status [ER+]: Secondary | ICD-10-CM | POA: Diagnosis not present

## 2019-09-30 DIAGNOSIS — D0512 Intraductal carcinoma in situ of left breast: Secondary | ICD-10-CM | POA: Diagnosis not present

## 2019-09-30 DIAGNOSIS — Z510 Encounter for antineoplastic radiation therapy: Secondary | ICD-10-CM | POA: Diagnosis not present

## 2019-10-01 ENCOUNTER — Ambulatory Visit
Admission: RE | Admit: 2019-10-01 | Discharge: 2019-10-01 | Disposition: A | Discharge status: Home / Self Care | Payer: Federal, State, Local not specified - PPO | Source: Ambulatory Visit | Attending: Radiation Oncology | Admitting: Radiation Oncology

## 2019-10-01 DIAGNOSIS — D0512 Intraductal carcinoma in situ of left breast: Secondary | ICD-10-CM | POA: Diagnosis not present

## 2019-10-01 DIAGNOSIS — Z170 Estrogen receptor positive status [ER+]: Secondary | ICD-10-CM | POA: Diagnosis not present

## 2019-10-01 DIAGNOSIS — Z510 Encounter for antineoplastic radiation therapy: Secondary | ICD-10-CM | POA: Diagnosis not present

## 2019-10-02 ENCOUNTER — Ambulatory Visit
Admission: RE | Admit: 2019-10-02 | Discharge: 2019-10-02 | Disposition: A | Discharge status: Home / Self Care | Payer: Federal, State, Local not specified - PPO | Source: Ambulatory Visit | Attending: Radiation Oncology | Admitting: Radiation Oncology

## 2019-10-02 DIAGNOSIS — Z170 Estrogen receptor positive status [ER+]: Secondary | ICD-10-CM | POA: Diagnosis not present

## 2019-10-02 DIAGNOSIS — D0512 Intraductal carcinoma in situ of left breast: Secondary | ICD-10-CM | POA: Diagnosis not present

## 2019-10-02 DIAGNOSIS — Z510 Encounter for antineoplastic radiation therapy: Secondary | ICD-10-CM | POA: Diagnosis not present

## 2019-10-03 ENCOUNTER — Other Ambulatory Visit: Payer: Self-pay

## 2019-10-03 ENCOUNTER — Ambulatory Visit
Admission: RE | Admit: 2019-10-03 | Discharge: 2019-10-03 | Disposition: A | Discharge status: Home / Self Care | Payer: Federal, State, Local not specified - PPO | Source: Ambulatory Visit | Attending: Radiation Oncology | Admitting: Radiation Oncology

## 2019-10-03 ENCOUNTER — Encounter: Payer: Self-pay | Admitting: Radiation Oncology

## 2019-10-03 DIAGNOSIS — D0512 Intraductal carcinoma in situ of left breast: Secondary | ICD-10-CM | POA: Diagnosis not present

## 2019-10-03 DIAGNOSIS — Z510 Encounter for antineoplastic radiation therapy: Secondary | ICD-10-CM | POA: Diagnosis not present

## 2019-10-03 DIAGNOSIS — Z170 Estrogen receptor positive status [ER+]: Secondary | ICD-10-CM | POA: Diagnosis not present

## 2019-10-06 ENCOUNTER — Ambulatory Visit
Admission: RE | Admit: 2019-10-06 | Discharge: 2019-10-06 | Disposition: A | Discharge status: Home / Self Care | Payer: Federal, State, Local not specified - PPO | Source: Ambulatory Visit | Attending: Radiation Oncology | Admitting: Radiation Oncology

## 2019-10-06 ENCOUNTER — Other Ambulatory Visit: Payer: Self-pay

## 2019-10-06 DIAGNOSIS — D0512 Intraductal carcinoma in situ of left breast: Secondary | ICD-10-CM | POA: Diagnosis not present

## 2019-10-06 DIAGNOSIS — Z170 Estrogen receptor positive status [ER+]: Secondary | ICD-10-CM | POA: Diagnosis not present

## 2019-10-06 DIAGNOSIS — Z510 Encounter for antineoplastic radiation therapy: Secondary | ICD-10-CM | POA: Diagnosis not present

## 2019-10-07 ENCOUNTER — Ambulatory Visit
Admission: RE | Admit: 2019-10-07 | Discharge: 2019-10-07 | Disposition: A | Discharge status: Home / Self Care | Payer: Federal, State, Local not specified - PPO | Source: Ambulatory Visit | Attending: Radiation Oncology | Admitting: Radiation Oncology

## 2019-10-07 ENCOUNTER — Other Ambulatory Visit: Payer: Self-pay

## 2019-10-07 DIAGNOSIS — D0512 Intraductal carcinoma in situ of left breast: Secondary | ICD-10-CM | POA: Diagnosis not present

## 2019-10-07 DIAGNOSIS — Z510 Encounter for antineoplastic radiation therapy: Secondary | ICD-10-CM | POA: Diagnosis not present

## 2019-10-07 DIAGNOSIS — Z170 Estrogen receptor positive status [ER+]: Secondary | ICD-10-CM | POA: Diagnosis not present

## 2019-10-08 ENCOUNTER — Ambulatory Visit
Admission: RE | Admit: 2019-10-08 | Discharge: 2019-10-08 | Disposition: A | Discharge status: Home / Self Care | Payer: Federal, State, Local not specified - PPO | Source: Ambulatory Visit | Attending: Radiation Oncology | Admitting: Radiation Oncology

## 2019-10-08 DIAGNOSIS — Z170 Estrogen receptor positive status [ER+]: Secondary | ICD-10-CM | POA: Diagnosis not present

## 2019-10-08 DIAGNOSIS — Z510 Encounter for antineoplastic radiation therapy: Secondary | ICD-10-CM | POA: Diagnosis not present

## 2019-10-08 DIAGNOSIS — D0512 Intraductal carcinoma in situ of left breast: Secondary | ICD-10-CM | POA: Diagnosis not present

## 2019-10-08 NOTE — Progress Notes (Signed)
  Radiation Oncology         [phone] ________________________________  Name: [name] MRN: [id number]  Date: 10/03/2019  DOB: Jul 02, 1965  SIMULATION NOTE   NARRATIVE:  The patient underwent simulation today for ongoing radiation therapy.  The existing CT study set was employed for the purpose of virtual treatment planning.  The target and avoidance structures were reviewed and modified as necessary.  Treatment planning then occurred.  The radiation boost prescription was entered and confirmed.  A total of 3 complex treatment devices were fabricated in the form of multi-leaf collimators to shape radiation around the targets while maximally excluding nearby normal structures. I have requested : Isodose Plan.    PLAN:  This modified radiation beam arrangement is intended to continue the current radiation dose to an additional 14 Gy in 7 fractions for a total cumulative dose of 64.4 Gy.    ------------------------------------------------  Jodelle Gross, MD, PhD

## 2019-10-09 ENCOUNTER — Ambulatory Visit
Admission: RE | Admit: 2019-10-09 | Discharge: 2019-10-09 | Disposition: A | Discharge status: Home / Self Care | Payer: Federal, State, Local not specified - PPO | Source: Ambulatory Visit | Attending: Radiation Oncology | Admitting: Radiation Oncology

## 2019-10-09 ENCOUNTER — Other Ambulatory Visit: Payer: Self-pay

## 2019-10-09 DIAGNOSIS — D0512 Intraductal carcinoma in situ of left breast: Secondary | ICD-10-CM | POA: Diagnosis not present

## 2019-10-09 DIAGNOSIS — Z170 Estrogen receptor positive status [ER+]: Secondary | ICD-10-CM | POA: Diagnosis not present

## 2019-10-09 DIAGNOSIS — Z510 Encounter for antineoplastic radiation therapy: Secondary | ICD-10-CM | POA: Diagnosis not present

## 2019-10-10 ENCOUNTER — Ambulatory Visit
Admission: RE | Admit: 2019-10-10 | Discharge: 2019-10-10 | Disposition: A | Discharge status: Home / Self Care | Payer: Federal, State, Local not specified - PPO | Source: Ambulatory Visit | Attending: Radiation Oncology | Admitting: Radiation Oncology

## 2019-10-10 ENCOUNTER — Ambulatory Visit: Payer: Federal, State, Local not specified - PPO | Admitting: Radiation Oncology

## 2019-10-10 DIAGNOSIS — Z170 Estrogen receptor positive status [ER+]: Secondary | ICD-10-CM | POA: Diagnosis not present

## 2019-10-10 DIAGNOSIS — D0512 Intraductal carcinoma in situ of left breast: Secondary | ICD-10-CM | POA: Diagnosis not present

## 2019-10-10 DIAGNOSIS — Z510 Encounter for antineoplastic radiation therapy: Secondary | ICD-10-CM | POA: Diagnosis not present

## 2019-10-10 MED ORDER — SONAFINE EX EMUL
1.0000 "application " | Freq: Two times a day (BID) | CUTANEOUS | Status: DC
  Administered 2019-10-10: 1 via TOPICAL

## 2019-10-13 ENCOUNTER — Ambulatory Visit
Admission: RE | Admit: 2019-10-13 | Discharge: 2019-10-13 | Disposition: A | Discharge status: Home / Self Care | Payer: Federal, State, Local not specified - PPO | Source: Ambulatory Visit | Attending: Radiation Oncology | Admitting: Radiation Oncology

## 2019-10-13 ENCOUNTER — Other Ambulatory Visit: Payer: Self-pay

## 2019-10-13 DIAGNOSIS — Z170 Estrogen receptor positive status [ER+]: Secondary | ICD-10-CM | POA: Diagnosis not present

## 2019-10-13 DIAGNOSIS — D0512 Intraductal carcinoma in situ of left breast: Secondary | ICD-10-CM | POA: Diagnosis not present

## 2019-10-13 DIAGNOSIS — Z510 Encounter for antineoplastic radiation therapy: Secondary | ICD-10-CM | POA: Diagnosis not present

## 2019-10-14 ENCOUNTER — Ambulatory Visit
Admission: RE | Admit: 2019-10-14 | Discharge: 2019-10-14 | Disposition: A | Discharge status: Home / Self Care | Payer: Federal, State, Local not specified - PPO | Source: Ambulatory Visit | Attending: Radiation Oncology | Admitting: Radiation Oncology

## 2019-10-14 ENCOUNTER — Other Ambulatory Visit: Payer: Self-pay

## 2019-10-14 DIAGNOSIS — D0512 Intraductal carcinoma in situ of left breast: Secondary | ICD-10-CM | POA: Diagnosis not present

## 2019-10-14 DIAGNOSIS — Z510 Encounter for antineoplastic radiation therapy: Secondary | ICD-10-CM | POA: Diagnosis not present

## 2019-10-14 DIAGNOSIS — Z170 Estrogen receptor positive status [ER+]: Secondary | ICD-10-CM | POA: Diagnosis not present

## 2019-10-15 ENCOUNTER — Ambulatory Visit
Admission: RE | Admit: 2019-10-15 | Discharge: 2019-10-15 | Disposition: A | Discharge status: Home / Self Care | Payer: Federal, State, Local not specified - PPO | Source: Ambulatory Visit | Attending: Radiation Oncology | Admitting: Radiation Oncology

## 2019-10-15 DIAGNOSIS — Z510 Encounter for antineoplastic radiation therapy: Secondary | ICD-10-CM | POA: Diagnosis not present

## 2019-10-15 DIAGNOSIS — D0512 Intraductal carcinoma in situ of left breast: Secondary | ICD-10-CM | POA: Diagnosis not present

## 2019-10-15 DIAGNOSIS — Z170 Estrogen receptor positive status [ER+]: Secondary | ICD-10-CM | POA: Diagnosis not present

## 2019-10-16 ENCOUNTER — Ambulatory Visit
Admission: RE | Admit: 2019-10-16 | Discharge: 2019-10-16 | Disposition: A | Discharge status: Home / Self Care | Payer: Federal, State, Local not specified - PPO | Source: Ambulatory Visit | Attending: Radiation Oncology | Admitting: Radiation Oncology

## 2019-10-16 DIAGNOSIS — Z170 Estrogen receptor positive status [ER+]: Secondary | ICD-10-CM | POA: Diagnosis not present

## 2019-10-16 DIAGNOSIS — D0512 Intraductal carcinoma in situ of left breast: Secondary | ICD-10-CM | POA: Diagnosis not present

## 2019-10-16 DIAGNOSIS — Z510 Encounter for antineoplastic radiation therapy: Secondary | ICD-10-CM | POA: Diagnosis not present

## 2019-10-17 ENCOUNTER — Ambulatory Visit
Admission: RE | Admit: 2019-10-17 | Discharge: 2019-10-17 | Disposition: A | Discharge status: Home / Self Care | Payer: Federal, State, Local not specified - PPO | Source: Ambulatory Visit | Attending: Radiation Oncology | Admitting: Radiation Oncology

## 2019-10-17 DIAGNOSIS — D0512 Intraductal carcinoma in situ of left breast: Secondary | ICD-10-CM | POA: Diagnosis not present

## 2019-10-17 DIAGNOSIS — Z170 Estrogen receptor positive status [ER+]: Secondary | ICD-10-CM | POA: Diagnosis not present

## 2019-10-17 DIAGNOSIS — Z510 Encounter for antineoplastic radiation therapy: Secondary | ICD-10-CM | POA: Diagnosis not present

## 2019-10-20 ENCOUNTER — Ambulatory Visit
Admission: RE | Admit: 2019-10-20 | Discharge: 2019-10-20 | Disposition: A | Discharge status: Home / Self Care | Payer: Federal, State, Local not specified - PPO | Source: Ambulatory Visit | Attending: Radiation Oncology | Admitting: Radiation Oncology

## 2019-10-20 ENCOUNTER — Other Ambulatory Visit: Payer: Self-pay

## 2019-10-20 DIAGNOSIS — Z510 Encounter for antineoplastic radiation therapy: Secondary | ICD-10-CM | POA: Diagnosis not present

## 2019-10-20 DIAGNOSIS — Z170 Estrogen receptor positive status [ER+]: Secondary | ICD-10-CM | POA: Diagnosis not present

## 2019-10-20 DIAGNOSIS — D0512 Intraductal carcinoma in situ of left breast: Secondary | ICD-10-CM | POA: Diagnosis not present

## 2019-10-21 ENCOUNTER — Other Ambulatory Visit: Payer: Self-pay

## 2019-10-21 ENCOUNTER — Ambulatory Visit
Admission: RE | Admit: 2019-10-21 | Discharge: 2019-10-21 | Disposition: A | Discharge status: Home / Self Care | Payer: Federal, State, Local not specified - PPO | Source: Ambulatory Visit | Attending: Radiation Oncology | Admitting: Radiation Oncology

## 2019-10-21 DIAGNOSIS — D0512 Intraductal carcinoma in situ of left breast: Secondary | ICD-10-CM | POA: Diagnosis not present

## 2019-10-21 DIAGNOSIS — Z510 Encounter for antineoplastic radiation therapy: Secondary | ICD-10-CM | POA: Diagnosis not present

## 2019-10-21 DIAGNOSIS — Z170 Estrogen receptor positive status [ER+]: Secondary | ICD-10-CM | POA: Diagnosis not present

## 2019-10-22 ENCOUNTER — Other Ambulatory Visit: Payer: Self-pay

## 2019-10-22 ENCOUNTER — Ambulatory Visit
Admission: RE | Admit: 2019-10-22 | Discharge: 2019-10-22 | Disposition: A | Discharge status: Home / Self Care | Payer: Federal, State, Local not specified - PPO | Source: Ambulatory Visit | Attending: Radiation Oncology | Admitting: Radiation Oncology

## 2019-10-22 DIAGNOSIS — D0512 Intraductal carcinoma in situ of left breast: Secondary | ICD-10-CM | POA: Diagnosis not present

## 2019-10-22 DIAGNOSIS — Z510 Encounter for antineoplastic radiation therapy: Secondary | ICD-10-CM | POA: Diagnosis not present

## 2019-10-22 DIAGNOSIS — Z170 Estrogen receptor positive status [ER+]: Secondary | ICD-10-CM | POA: Diagnosis not present

## 2019-10-23 ENCOUNTER — Other Ambulatory Visit: Payer: Self-pay

## 2019-10-23 ENCOUNTER — Ambulatory Visit
Admission: RE | Admit: 2019-10-23 | Discharge: 2019-10-23 | Disposition: A | Discharge status: Home / Self Care | Payer: Federal, State, Local not specified - PPO | Source: Ambulatory Visit | Attending: Radiation Oncology | Admitting: Radiation Oncology

## 2019-10-23 DIAGNOSIS — Z510 Encounter for antineoplastic radiation therapy: Secondary | ICD-10-CM | POA: Diagnosis not present

## 2019-10-23 DIAGNOSIS — D0512 Intraductal carcinoma in situ of left breast: Secondary | ICD-10-CM | POA: Diagnosis not present

## 2019-10-23 DIAGNOSIS — Z170 Estrogen receptor positive status [ER+]: Secondary | ICD-10-CM | POA: Diagnosis not present

## 2019-10-24 ENCOUNTER — Ambulatory Visit
Admission: RE | Admit: 2019-10-24 | Discharge: 2019-10-24 | Disposition: A | Discharge status: Home / Self Care | Payer: Federal, State, Local not specified - PPO | Source: Ambulatory Visit | Attending: Radiation Oncology | Admitting: Radiation Oncology

## 2019-10-24 DIAGNOSIS — Z170 Estrogen receptor positive status [ER+]: Secondary | ICD-10-CM | POA: Diagnosis not present

## 2019-10-24 DIAGNOSIS — Z510 Encounter for antineoplastic radiation therapy: Secondary | ICD-10-CM | POA: Diagnosis not present

## 2019-10-24 DIAGNOSIS — D0512 Intraductal carcinoma in situ of left breast: Secondary | ICD-10-CM | POA: Diagnosis not present

## 2019-10-26 NOTE — Progress Notes (Signed)
Patient Care Team: Maurice Small, MD as PCP - General (Family Medicine) Mauro Kaufmann, RN as Oncology Nurse Navigator Rockwell Germany, RN as Oncology Nurse Navigator Stark Klein, MD as Consulting Physician (General Surgery) Nicholas Lose, MD as Consulting Physician (Hematology and Oncology) Kyung Rudd, MD as Consulting Physician (Radiation Oncology)  DIAGNOSIS:    ICD-10-CM   1. Ductal carcinoma in situ (DCIS) of left breast  D05.12     SUMMARY OF ONCOLOGIC HISTORY: Oncology History  Ductal carcinoma in situ (DCIS) of left breast  07/11/2019 Initial Diagnosis   Screening mammogram showed indeterminate left breast calcifications, 2.0cm, 12:30 position. Biopsy showed DCIS, grade 2, ER+ 95%, PR+ 20%.    08/01/2019 Surgery   Left lumpectomy: HG DCIS with necrosis margins neg, 0/2 LN neg ER 95%, PR 20%   09/09/2019 -  Radiation Therapy   Adjuvant radiation     CHIEF COMPLIANT: Follow-up of left breast DCIS to discuss antiestrogen therapy  INTERVAL HISTORY: [name] is a 54 y.o. with above-mentioned history of left breast DCIS who underwent a left lumpectomy and is currently undergoing radiation. She presents to the clinic today to discuss antiestrogen therapy.  She tolerated radiation extremely well without any problems or concerns.  ALLERGIES:  has No Known Allergies.  MEDICATIONS:  Current Outpatient Medications  Medication Sig Dispense Refill  . amLODipine (NORVASC) 5 MG tablet Take 5 mg by mouth daily. (Patient not taking: Reported on 08/26/2019)    . ascorbic acid (VITAMIN C) 500 MG tablet Take 500 mg by mouth daily.    Marland Kitchen CALCIUM PO Take 1,200 mg by mouth daily.     Marland Kitchen FLUoxetine (PROZAC) 40 MG capsule Take 40 mg by mouth daily.    . hydrochlorothiazide (HYDRODIURIL) 12.5 MG tablet Take 12.5 mg by mouth daily.    Marland Kitchen levonorgestrel (MIRENA) 20 MCG/24HR IUD 1 each by Intrauterine route once. Inserted 2017    . Multiple Vitamin (MULTIVITAMIN WITH MINERALS) TABS tablet  Take 1 tablet by mouth daily.    Marland Kitchen oxyCODONE (OXY IR/ROXICODONE) 5 MG immediate release tablet Take 1 tablet (5 mg total) by mouth every 6 (six) hours as needed for severe pain. (Patient not taking: Reported on 08/26/2019) 20 tablet 0  . triamcinolone cream (KENALOG) 0.1 % Apply 1 application topically 2 (two) times daily as needed (rash).      No current facility-administered medications for this visit.    PHYSICAL EXAMINATION: ECOG PERFORMANCE STATUS: 1 - Symptomatic but completely ambulatory  Vitals:   10/27/19 1528  BP: (!) 125/56  Pulse: 71  Resp: 17  Temp: 97.9 F (36.6 C)  SpO2: 99%   Filed Weights   10/27/19 1528  Weight: 171 lb 12.8 oz (77.9 kg)    LABORATORY DATA:  I have reviewed the data as listed CMP Latest Ref Rng & Units 07/24/2019 07/16/2019 12/25/2018  Glucose 70 - 99 mg/dL 90 98 129(H)  BUN 6 - 20 mg/dL 19 17 14   Creatinine 0.44 - 1.00 mg/dL 0.81 0.86 0.65  Sodium 135 - 145 mmol/L 137 137 135  Potassium 3.5 - 5.1 mmol/L 4.4 4.6 3.9  Chloride 98 - 111 mmol/L 101 106 102  CO2 22 - 32 mmol/L 27 24 23   Calcium 8.9 - 10.3 mg/dL 9.4 9.4 8.6(L)  Total Protein 6.5 - 8.1 g/dL - 7.4 7.1  Total Bilirubin 0.3 - 1.2 mg/dL - 0.4 0.6  Alkaline Phos 38 - 126 U/L - 70 65  AST 15 - 41 U/L - 24  29  ALT 0 - 44 U/L - 28 34    Lab Results  Component Value Date   WBC 8.9 07/24/2019   HGB 13.8 07/24/2019   HCT 41.5 07/24/2019   MCV 87.0 07/24/2019   PLT 416 (H) 07/24/2019   NEUTROABS 2.8 07/16/2019    ASSESSMENT & PLAN:  Ductal carcinoma in situ (DCIS) of left breast /18/2021:Screening mammogram showed indeterminate left breast calcifications, 2.0cm, 12:30 position. Biopsy showed DCISwith suspicion of microinvasion, grade 2, ER+ 95%, PR+ 20%.  Recommendation: 1. Breast conserving surgery 08/01/19: Left lumpectomy: HG DCIS with necrosis margins neg, 0/2 LN neg ER 95%, PR 20% 2. Followed by adjuvant radiation therapy 09/09/2019-10/24/2019 3. Followed by antiestrogen  therapy with tamoxifen 5 years  Tamoxifen counseling: We discussed the risks and benefits of tamoxifen. These include but not limited to insomnia, hot flashes, mood changes, vaginal dryness, and weight gain. Although rare, serious side effects including endometrial cancer, risk of blood clots were also discussed. We strongly believe that the benefits far outweigh the risks. Patient understands these risks and consented to starting treatment. Planned treatment duration is 5 years.  She will start at 10 mg daily and if she tolerates it well she will increase it to 20 mg daily.  Return to clinic in 3 months for survivorship care plan visit    No orders of the defined types were placed in this encounter.  The patient has a good understanding of the overall plan. she agrees with it. she will call with any problems that may develop before the next visit here.  Total time spent: 30 mins including face to face time and time spent for planning, charting and coordination of care  Nicholas Lose, MD 10/27/2019  I, Cloyde Reams Dorshimer, am acting as scribe for Dr. Nicholas Lose.  I have reviewed the above documentation for accuracy and completeness, and I agree with the above.

## 2019-10-27 ENCOUNTER — Inpatient Hospital Stay
Payer: Federal, State, Local not specified - PPO | Attending: Hematology and Oncology | Admitting: Hematology and Oncology

## 2019-10-27 ENCOUNTER — Other Ambulatory Visit: Payer: Self-pay

## 2019-10-27 ENCOUNTER — Encounter: Payer: Self-pay | Admitting: Radiation Oncology

## 2019-10-27 ENCOUNTER — Encounter: Payer: Self-pay | Admitting: *Deleted

## 2019-10-27 ENCOUNTER — Ambulatory Visit
Admission: RE | Admit: 2019-10-27 | Discharge: 2019-10-27 | Disposition: A | Discharge status: Home / Self Care | Payer: Federal, State, Local not specified - PPO | Source: Ambulatory Visit | Attending: Radiation Oncology | Admitting: Radiation Oncology

## 2019-10-27 DIAGNOSIS — Z170 Estrogen receptor positive status [ER+]: Secondary | ICD-10-CM | POA: Diagnosis not present

## 2019-10-27 DIAGNOSIS — D0512 Intraductal carcinoma in situ of left breast: Secondary | ICD-10-CM | POA: Diagnosis not present

## 2019-10-27 DIAGNOSIS — Z510 Encounter for antineoplastic radiation therapy: Secondary | ICD-10-CM | POA: Diagnosis not present

## 2019-10-27 DIAGNOSIS — Z79899 Other long term (current) drug therapy: Secondary | ICD-10-CM | POA: Diagnosis not present

## 2019-10-27 MED ORDER — TAMOXIFEN CITRATE 20 MG PO TABS
20.0000 mg | ORAL_TABLET | Freq: Every day | ORAL | 3 refills | Status: DC
Start: 2019-10-27 — End: 2020-09-01

## 2019-10-27 NOTE — Assessment & Plan Note (Signed)
/  18/2021:Screening mammogram showed indeterminate left breast calcifications, 2.0cm, 12:30 position. Biopsy showed DCISwith suspicion of microinvasion, grade 2, ER+ 95%, PR+ 20%.  Recommendation: 1. Breast conserving surgery 08/01/19: Left lumpectomy: HG DCIS with necrosis margins neg, 0/2 LN neg ER 95%, PR 20% 2. Followed by adjuvant radiation therapy 09/09/2019-10/24/2019 3. Followed by antiestrogen therapy with tamoxifen 5 years  Tamoxifen counseling: We discussed the risks and benefits of tamoxifen. These include but not limited to insomnia, hot flashes, mood changes, vaginal dryness, and weight gain. Although rare, serious side effects including endometrial cancer, risk of blood clots were also discussed. We strongly believe that the benefits far outweigh the risks. Patient understands these risks and consented to starting treatment. Planned treatment duration is 5 years.    Return to clinic in 3 months for survivorship care plan visit

## 2019-11-14 NOTE — Progress Notes (Signed)
  Radiation Oncology         (336) [phone] ________________________________  Name: [name] MRN: [id number]  Date: 10/27/2019  DOB: 12/11/1965  End of Treatment Note  Diagnosis:   left-sided breast cancer     Indication for treatment:  Curative       Radiation treatment dates:   09/08/2019 through 10/27/2019  Site/dose:   The patient initially received a dose of 50.4 Gy in 28 fractions to the breast using whole-breast tangent fields. This was delivered using a 3-D conformal technique. The patient then received a boost to the seroma. This delivered an additional 10 Gy in 5 fractions using a 3 field photon boost technique. The total dose was 60.4 Gy.  Narrative: The patient tolerated radiation treatment relatively well.   The patient had some expected skin irritation as she progressed during treatment.    Plan: The patient has completed radiation treatment. The patient will return to radiation oncology clinic for routine followup in one month. I advised the patient to call or return sooner if they have any questions or concerns related to their recovery or treatment. ________________________________  Jodelle Gross, M.D., Ph.D.

## 2019-12-08 ENCOUNTER — Telehealth: Payer: Self-pay | Admitting: Radiation Oncology

## 2019-12-08 NOTE — Telephone Encounter (Signed)
  Radiation Oncology         (336) [phone] ________________________________  Name: [name] MRN: [id number]  Date of Service: 12/08/2019  DOB: Jun 19, 1965  Post Treatment Telephone Note  Diagnosis:   High Grade ER/PR positive DCIS of the left breast.  Interval Since Last Radiation:  6 weeks   09/08/2019 through 10/27/2019: The patient initially received a dose of 50.4 Gy in 28 fractions to the breast using whole-breast tangent fields. This was delivered using a 3-D conformal technique. The patient then received a boost to the seroma. This delivered an additional 10 Gy in 5 fractions using a 3 field photon boost technique. The total dose was 60.4 Gy.   Narrative:  The patient was contacted today for routine follow-up. During treatment she did very well with radiotherapy and did not have significant desquamation.  Impression/Plan: 1. High Grade ER/PR positive DCIS of the left breast. I was unable to reach the patient but left a voicemail and on it discussed that we would be happy to continue to follow her as needed, but she will also continue to follow up with Dr. Lindi Adie in medical oncology. She was counseled on skin care as well as measures to avoid sun exposure to this area.  2. Survivorship. I discussed the importance of survivorship evaluation and encouraged her to attend her upcoming visit with that clinic.    Carola Rhine, PAC

## 2019-12-29 ENCOUNTER — Ambulatory Visit: Payer: Federal, State, Local not specified - PPO | Admitting: Skilled Nursing Facility1

## 2020-01-15 ENCOUNTER — Telehealth: Payer: Self-pay | Admitting: Adult Health

## 2020-01-15 NOTE — Telephone Encounter (Signed)
Left message to reschedule appointment due to provider on call. 

## 2020-01-22 ENCOUNTER — Telehealth: Payer: Self-pay | Admitting: Adult Health

## 2020-01-22 NOTE — Telephone Encounter (Signed)
Rescheduled appts per provider template change. Left voicemail with cancellation and new appt date and time.

## 2020-02-02 ENCOUNTER — Encounter: Payer: Federal, State, Local not specified - PPO | Admitting: Adult Health

## 2020-02-05 ENCOUNTER — Telehealth: Payer: Self-pay | Admitting: Adult Health

## 2020-02-05 NOTE — Telephone Encounter (Signed)
Rescheduled follow-up appointment per 1/13 schedule message due to patient being out of town this month. Patient is aware of changes.

## 2020-02-06 ENCOUNTER — Encounter: Payer: Federal, State, Local not specified - PPO | Admitting: Adult Health

## 2020-02-10 ENCOUNTER — Ambulatory Visit: Payer: Federal, State, Local not specified - PPO | Admitting: Skilled Nursing Facility1

## 2020-02-12 ENCOUNTER — Encounter: Payer: Federal, State, Local not specified - PPO | Admitting: Adult Health

## 2020-02-27 ENCOUNTER — Encounter: Payer: Self-pay | Admitting: Adult Health

## 2020-02-27 ENCOUNTER — Other Ambulatory Visit: Payer: Self-pay

## 2020-02-27 ENCOUNTER — Inpatient Hospital Stay: Payer: Federal, State, Local not specified - PPO | Attending: Adult Health | Admitting: Adult Health

## 2020-02-27 VITALS — BP 124/69 | HR 77 | Temp 97.7°F | Resp 18 | Ht 65.0 in | Wt 172.2 lb

## 2020-02-27 DIAGNOSIS — Z79810 Long term (current) use of selective estrogen receptor modulators (SERMs): Secondary | ICD-10-CM | POA: Insufficient documentation

## 2020-02-27 DIAGNOSIS — D0512 Intraductal carcinoma in situ of left breast: Secondary | ICD-10-CM | POA: Diagnosis not present

## 2020-02-27 DIAGNOSIS — R232 Flushing: Secondary | ICD-10-CM | POA: Insufficient documentation

## 2020-02-27 DIAGNOSIS — E119 Type 2 diabetes mellitus without complications: Secondary | ICD-10-CM | POA: Insufficient documentation

## 2020-02-27 DIAGNOSIS — R5383 Other fatigue: Secondary | ICD-10-CM | POA: Insufficient documentation

## 2020-02-27 DIAGNOSIS — Z923 Personal history of irradiation: Secondary | ICD-10-CM | POA: Insufficient documentation

## 2020-02-27 DIAGNOSIS — N943 Premenstrual tension syndrome: Secondary | ICD-10-CM | POA: Insufficient documentation

## 2020-02-27 DIAGNOSIS — Z79899 Other long term (current) drug therapy: Secondary | ICD-10-CM | POA: Insufficient documentation

## 2020-02-27 DIAGNOSIS — E7800 Pure hypercholesterolemia, unspecified: Secondary | ICD-10-CM | POA: Diagnosis not present

## 2020-02-27 DIAGNOSIS — Z170 Estrogen receptor positive status [ER+]: Secondary | ICD-10-CM | POA: Insufficient documentation

## 2020-02-27 DIAGNOSIS — I10 Essential (primary) hypertension: Secondary | ICD-10-CM | POA: Diagnosis not present

## 2020-02-27 NOTE — Progress Notes (Signed)
SURVIVORSHIP  VISIT:  BRIEF ONCOLOGIC HISTORY:  Oncology History  Ductal carcinoma in situ (DCIS) of left breast  07/11/2019 Initial Diagnosis   Screening mammogram showed indeterminate left breast calcifications, 2.0cm, 12:30 position. Biopsy showed DCIS, grade 2, ER+ 95%, PR+ 20%.    08/01/2019 Surgery   Left lumpectomy Barry Dienes) [phone]): high grade DCIS with necrosis, ER 95%, PR 20%. 2 regional lymph nodes were negative for carcinoma. Clear margins.   09/08/2019 - 10/27/2019 Radiation Therapy   The patient initially received a dose of 50.4 Gy in 28 fractions to the breast using whole-breast tangent fields. This was delivered using a 3-D conformal technique. The patient then received a boost to the seroma. This delivered an additional 10 Gy in 5 fractions using a 3 field photon boost technique. The total dose was 60.4 Gy.   10/2019 - 10/2024 Anti-estrogen oral therapy   Tamoxifen   01/31/2020 Cancer Staging   Staging form: Breast, AJCC 8th Edition - Pathologic: Stage 0 (pTis (DCIS), pN0, cM0, ER+, PR+)     INTERVAL HISTORY:  [name] to review her survivorship care plan detailing her treatment course for breast cancer, as well as monitoring long-term side effects of that treatment, education regarding health maintenance, screening, and overall wellness and health promotion.     Overall, [name] reports feeling quite well.  She is taking Tamoxifen and notes some occasional hot flashes, that interfere with her sleep.  She is mildly fatigued, overall she is doing well.    REVIEW OF SYSTEMS:  Review of Systems  Constitutional: Positive for fatigue. Negative for appetite change, chills, fever and unexpected weight change.  HENT:   Negative for hearing loss, lump/mass and trouble swallowing.   Eyes: Negative for eye problems and icterus.  Respiratory: Negative for chest tightness, cough and shortness of breath.   Cardiovascular: Negative for chest pain, leg swelling and  palpitations.  Gastrointestinal: Negative for abdominal distention, abdominal pain, constipation, diarrhea, nausea and vomiting.  Endocrine: Positive for hot flashes.  Genitourinary: Negative for difficulty urinating.   Musculoskeletal: Negative for arthralgias.  Skin: Negative for itching and rash.  Neurological: Negative for dizziness, extremity weakness, headaches and numbness.  Hematological: Negative for adenopathy. Does not bruise/bleed easily.  Psychiatric/Behavioral: Negative for depression. The patient is not nervous/anxious.    Breast: Denies any new nodularity, masses, tenderness, nipple changes, or nipple discharge.      ONCOLOGY TREATMENT TEAM:  1. Surgeon:  Dr. Barry Dienes at [facility] 2. Medical Oncologist: Dr. Lindi Adie  3. Radiation Oncologist: Dr. Lisbeth Renshaw    PAST MEDICAL/SURGICAL HISTORY:  Past Medical History:  Diagnosis Date  . Complication of anesthesia 1997   facial swelling postop due to anesthesia per pt cyst removal from head  . Diabetes mellitus without complication (HCC)    type 2, diet controlled  . High cholesterol   . Hypertension   . Lumbar disc disorder    lower lumbar   Past Surgical History:  Procedure Laterality Date  . BREAST LUMPECTOMY WITH RADIOACTIVE SEED AND SENTINEL LYMPH NODE BIOPSY Left 08/01/2019   Procedure: LEFT BREAST LUMPECTOMY WITH RADIOACTIVE SEED AND SENTINEL LYMPH NODE BIOPSY;  Surgeon: Stark Klein, MD;  Location: [facility];  Service: General;  Laterality: Left;  COMBINE WITH REGIONAL FOR POST OP PAIN  . BREAST SURGERY Bilateral 2019 may   reduction  . BREAST SURGERY Right    for an infection  . CHOLECYSTECTOMY    . CYST EXCISION  1997   from scalp  . DILATION  AND CURETTAGE OF UTERUS    . hysterscopy  2014  . LAPAROSCOPIC GASTRIC SLEEVE RESECTION N/A 12/24/2018   Procedure: LAPAROSCOPIC GASTRIC SLEEVE RESECTION, Upper Endo, ERAS Pathway;  Surgeon: Greer Pickerel, MD;  Location: [facility];  Service: General;  Laterality:  N/A;     ALLERGIES:  No Known Allergies   CURRENT MEDICATIONS:  Outpatient Encounter Medications as of 02/27/2020  Medication Sig  . ascorbic acid (VITAMIN C) 500 MG tablet Take 500 mg by mouth daily.  Marland Kitchen CALCIUM PO Take 1,200 mg by mouth daily.   Marland Kitchen FLUoxetine (PROZAC) 40 MG capsule Take 40 mg by mouth daily.  . hydrochlorothiazide (HYDRODIURIL) 12.5 MG tablet Take 12.5 mg by mouth daily.  Marland Kitchen levonorgestrel (MIRENA) 20 MCG/24HR IUD 1 each by Intrauterine route once. Inserted 2017  . Multiple Vitamin (MULTIVITAMIN WITH MINERALS) TABS tablet Take 1 tablet by mouth daily.  . tamoxifen (NOLVADEX) 20 MG tablet Take 1 tablet (20 mg total) by mouth daily.   No facility-administered encounter medications on file as of 02/27/2020.     ONCOLOGIC FAMILY HISTORY:  Family History  Problem Relation Age of Onset  . Diabetes Mother   . Hypertension Mother   . Diabetes Father   . Hypertension Father      GENETIC COUNSELING/TESTING: See above  SOCIAL HISTORY:  Social History   Socioeconomic History  . Marital status: Divorced    Spouse name: Not on file  . Number of children: Not on file  . Years of education: Not on file  . Highest education level: Not on file  Occupational History  . Not on file  Tobacco Use  . Smoking status: Never Smoker  . Smokeless tobacco: Never Used  Vaping Use  . Vaping Use: Never used  Substance and Sexual Activity  . Alcohol use: Not Currently  . Drug use: No  . Sexual activity: Yes    Birth control/protection: I.U.D.  Other Topics Concern  . Not on file  Social History Narrative  . Not on file   Social Determinants of Health   Financial Resource Strain: Not on file  Food Insecurity: Not on file  Transportation Needs: Not on file  Physical Activity: Not on file  Stress: Not on file  Social Connections: Not on file  Intimate Partner Violence: Not on file     OBSERVATIONS/OBJECTIVE:  BP 124/69 (BP Location: Left Arm, Patient Position:  Sitting)   Pulse 77   Temp 97.7 F (36.5 C) (Temporal)   Resp 18   Ht 5\' 5"  (1.651 m)   Wt 172 lb 3.2 oz (78.1 kg)   SpO2 100%   BMI 28.66 kg/m  GENERAL: Patient is a well appearing female in no acute distress HEENT:  Sclerae anicteric.  Oropharynx clear and moist. No ulcerations or evidence of oropharyngeal candidiasis. Neck is supple.  NODES:  No cervical, supraclavicular, or axillary lymphadenopathy palpated.  BREAST EXAM:  Left breast s/p lumpectomy and radiation, no sign of local recurrence, right breast benign LUNGS:  Clear to auscultation bilaterally.  No wheezes or rhonchi. HEART:  Regular rate and rhythm. No murmur appreciated. ABDOMEN:  Soft, nontender.  Positive, normoactive bowel sounds. No organomegaly palpated. MSK:  No focal spinal tenderness to palpation. Full range of motion bilaterally in the upper extremities. EXTREMITIES:  No peripheral edema.   SKIN:  Clear with no obvious rashes or skin changes. No nail dyscrasia. NEURO:  Nonfocal. Well oriented.  Appropriate affect.    LABORATORY DATA:  None for this visit.  DIAGNOSTIC IMAGING:  None for this visit.      ASSESSMENT AND PLAN:  Ms.. [name] is a pleasant 55 y.o. female with Stage 0 left breast DCIS, ER+/PR+, diagnosed in 06/2019, treated with lumpectomy, adjuvant radiation therapy, and anti-estrogen therapy with Tamoxifen beginning in 10/2019.  She presents to the Survivorship Clinic for our initial meeting and routine follow-up post-completion of treatment for breast cancer.    1. Stage 0 left breast cancer:  [name] is continuing to recover from definitive treatment for breast cancer. She will follow-up with her medical oncologist, Dr. Lindi Adie in 09/2021 with history and physical exam per surveillance protocol.  She will continue her anti-estrogen therapy with Tamoxifen. Thus far, she is tolerating the Tamoxifen well, with minimal side effects. She was instructed to make Dr. Lindi Adie or myself aware if she  begins to experience any worsening side effects of the medication and I could see her back in clinic to help manage those side effects, as needed. Her mammogram is due 05/2020; orders placed today.  Today, a comprehensive survivorship care plan and treatment summary was reviewed with the patient today detailing her breast cancer diagnosis, treatment course, potential late/long-term effects of treatment, appropriate follow-up care with recommendations for the future, and patient education resources.  A copy of this summary, along with a letter will be sent to the patient's primary care provider via mail/fax/In Basket message after today's visit.    2. Hot flashes: I recommended that she take the tamoxifen at a different time of day to see if it helps hot flashes.  We discussed Gabapentin and Effexor which may help as well.  She is going to try changing the time of day first.    3. Bone health:    She was given education on specific activities to promote bone heal  4. Cancer screening:  Due to [name]'s history and her age, she should receive screening for skin cancers, colon cancer, and gynecologic cancers.  The information and recommendations are listed on the patient's comprehensive care plan/treatment summary and were reviewed in detail with the patient.    5. Health maintenance and wellness promotion: [name] was encouraged to consume 5-7 servings of fruits and vegetables per day. We reviewed the "Nutrition Rainbow" handout, as well as the handout "Take Control of Your Health and Reduce Your Cancer Risk" from the [employer].  She was also encouraged to engage in moderate to vigorous exercise for 30 minutes per day most days of the week. We discussed the [employer] fitness program, which is designed for cancer survivors to help them become more physically fit after cancer treatments.  She was instructed to limit her alcohol consumption and continue to abstain from tobacco use.      6. Support services/counseling: It is not uncommon for this period of the patient's cancer care trajectory to be one of many emotions and stressors.  We discussed how this can be increasingly difficult during the times of quarantine and social distancing due to the COVID-19 pandemic.   She was given information regarding our available services and encouraged to contact me with any questions or for help enrolling in any of our support group/programs.    Follow up instructions:    -Return to cancer center in 6 months for f/u  -Mammogram due in 05/2020 -Follow up with Dr. Barry Dienes in 02/2021 -She is welcome to return back to the Survivorship Clinic at any time; no additional follow-up needed at this time.  -Consider  referral back to survivorship as a long-term survivor for continued surveillance  The patient was provided an opportunity to ask questions and all were answered. The patient agreed with the plan and demonstrated an understanding of the instructions.   Total encounter time: 30 minutes*  Wilber Bihari, NP 02/27/20 2:54 PM Medical Oncology and Hematology [facility] [place], [place] [address] Tel. [phone]    Fax. [phone]  *Total Encounter Time as defined by the Centers for Medicare and Medicaid Services includes, in addition to the face-to-face time of a patient visit (documented in the note above) non-face-to-face time: obtaining and reviewing outside history, ordering and reviewing medications, tests or procedures, care coordination (communications with other health care professionals or caregivers) and documentation in the medical record.

## 2020-03-01 ENCOUNTER — Telehealth: Payer: Self-pay | Admitting: Adult Health

## 2020-03-01 NOTE — Telephone Encounter (Signed)
No 2/4 los. No changes made to pt's schedule.  

## 2020-03-04 ENCOUNTER — Telehealth: Payer: Self-pay | Admitting: Hematology and Oncology

## 2020-03-04 ENCOUNTER — Encounter: Payer: Self-pay | Admitting: Licensed Clinical Social Worker

## 2020-03-04 NOTE — Telephone Encounter (Signed)
Called pt per 2/10 sch msg- pt is aware of appt date and time

## 2020-03-04 NOTE — Progress Notes (Signed)
[facility] Quality of Life Screening Clinical Social Work  Clinical Social Work was referred by survivorship quality of life screening protocol.  The patient scored a 32.01 on the Quality of Life/ Cancer Survivor (QOL-CS) scale which indicates moderate quality of life.   Clinical Social Worker attempted to contact patient by phone to assess for needs. Based on screener, fear of tests, second cancer, recurrence, and spreading are major concerns.   No answer. Left VM with direct contact information.     Syanne Looney E Elaine Middleton, LCSW

## 2020-03-08 ENCOUNTER — Other Ambulatory Visit: Payer: Self-pay

## 2020-03-08 ENCOUNTER — Telehealth: Payer: Self-pay | Admitting: Licensed Clinical Social Worker

## 2020-03-08 ENCOUNTER — Encounter: Payer: Federal, State, Local not specified - PPO | Attending: General Surgery | Admitting: Skilled Nursing Facility1

## 2020-03-08 DIAGNOSIS — E669 Obesity, unspecified: Secondary | ICD-10-CM | POA: Diagnosis not present

## 2020-03-08 DIAGNOSIS — E1169 Type 2 diabetes mellitus with other specified complication: Secondary | ICD-10-CM | POA: Insufficient documentation

## 2020-03-08 NOTE — Telephone Encounter (Signed)
Left 2nd VM in attempt to follow-up on survivorship screening and to offer information on [facility] class.   Christeen Douglas, LCSW

## 2020-03-08 NOTE — Progress Notes (Signed)
Bariatric Nutrition Follow-Up Visit Medical Nutrition Therapy   Post-Operative sleeve gastrecomy Surgery Surgery Date: 12/24/2018   NUTRITION ASSESSMENT    Anthropometrics  Start weight at [facility]: 218.3 lbs (date: 09/12/2017) Today's weight: pt declined  Body Composition Scale Date 03/17/19 05/26/2019  Weight  lbs 188.3 178.5 172  Total Body Fat  % 38.3 35.5 34.3     Visceral Fat 11 9 9   Fat-Free Mass  % 61.6 64.4 65.6     Total Body Water  % 45.3 46.7 47.3     Muscle-Mass  lbs 30.4 31.2 31.1  BMI 32.1 29.3 28.3  Body Fat Displacement ---          Torso  lbs 44.6 39.2 36.4        Left Leg  lbs 8.9 7.8 7.2        Right Leg  lbs 8.9 7.8 7.2        Left Arm  lbs 4.4 3.9 3.6        Right Arm  lbs 4.4 3.9 3.6   Clinical  Medical hx: diabetes, HTN Medications: no longer taking metformin  Labs:    Lifestyle & Dietary Hx   Pt states she is struggling with hot flashes from her tamoxifen so not getting good rest. Pt states she averages 1500 calories. Pt states she has been avoiding the scale to not focus too much and stress herself out.  Pt is doing well and has no issues to report. Dietitian advised t to call or e-mail if she needs anything.   Estimated daily fluid intake: 64 oz Estimated daily protein intake: 60+ g Supplements: celebrate and calcium  Current average weekly physical activity: resistance bands 3 times a week and walking 5 days a week 120 minutes using elliptical   24-Hr Dietary Recall First Meal: oatmeal + fruit or premier protein + banana Snack: cheese sometimes peanut butter  Second Meal:tuna or chicken or salmon on salad sometimes with sunflower seeds or nuts Snack: fruit Third Meal: chicken + broccoli Or greens Snack:  Beverages: water, propel, green tea, unsweet tea  Post-Op Goals/ Signs/ Symptoms Using straws: no Drinking while eating: no Chewing/swallowing difficulties: no Changes in vision: no Changes to mood/headaches: no Hair loss/changes to  skin/nails: no Difficulty focusing/concentrating: no Sweating: no Dizziness/lightheadedness: no Palpitations: no  Carbonated/caffeinated beverages: no N/V/D/C/Gas: no Abdominal pain: no Dumping syndrome: no    NUTRITION DIAGNOSIS  Overweight/obesity ([place]-3.3) related to past poor dietary habits and physical inactivity as evidenced by completed bariatric surgery and following dietary guidelines for continued weight loss and healthy nutrition status.     NUTRITION INTERVENTION contonued Nutrition counseling (C-1) and education (E-2) to facilitate bariatric surgery goals, including: . The importance of consuming adequate calories as well as certain nutrients daily due to the body's need for essential vitamins, minerals, and fats . The importance of daily physical activity and to reach a goal of at least 150 minutes of moderate to vigorous physical activity weekly (or as directed by their physician) due to benefits such as increased musculature and improved lab values . Why you need complex carbohydrates: Whole grains and other complex carbohydrates are required to have a healthy diet. Whole grains provide fiber which can help with blood glucose levels and help keep you satiated. Fruits and starchy vegetables provide essential vitamins and minerals required for immune function, eyesight support, brain support, bone density, wound healing and many other functions within the body. According to the current evidenced based 2015-2020 Dietary Guidelines for  Americans, complex carbohydrates are part of a healthy eating pattern which is associated with a decreased risk for type 2 diabetes, cancers, and cardiovascular disease.   Goals: continued  Lean on plant based proteins  Aim for 1-2 servings of soy per day  Handouts Provided Include     Learning Style & Readiness for Change Teaching method utilized: Visual & Auditory  Demonstrated degree of understanding via: Teach Back  Barriers to  learning/adherence to lifestyle change: none identified   RD's Notes for Next Visit . Assess adherence to pt chosen goals   MONITORING & EVALUATION Dietary intake, weekly physical activity, body weight  Next Steps Patient is to follow-up prn

## 2020-03-26 DIAGNOSIS — T451X5A Adverse effect of antineoplastic and immunosuppressive drugs, initial encounter: Secondary | ICD-10-CM | POA: Diagnosis not present

## 2020-03-26 DIAGNOSIS — Z170 Estrogen receptor positive status [ER+]: Secondary | ICD-10-CM | POA: Diagnosis not present

## 2020-03-26 DIAGNOSIS — C50412 Malignant neoplasm of upper-outer quadrant of left female breast: Secondary | ICD-10-CM | POA: Diagnosis not present

## 2020-03-26 DIAGNOSIS — R232 Flushing: Secondary | ICD-10-CM | POA: Diagnosis not present

## 2020-04-14 ENCOUNTER — Ambulatory Visit: Payer: Federal, State, Local not specified - PPO | Attending: General Surgery

## 2020-04-14 ENCOUNTER — Other Ambulatory Visit: Payer: Self-pay

## 2020-04-14 DIAGNOSIS — R293 Abnormal posture: Secondary | ICD-10-CM

## 2020-04-14 DIAGNOSIS — Z483 Aftercare following surgery for neoplasm: Secondary | ICD-10-CM | POA: Diagnosis not present

## 2020-04-14 DIAGNOSIS — M25512 Pain in left shoulder: Secondary | ICD-10-CM | POA: Insufficient documentation

## 2020-04-14 DIAGNOSIS — G8929 Other chronic pain: Secondary | ICD-10-CM

## 2020-04-14 NOTE — Therapy (Signed)
[place], [place], [address] Phone: [phone]   Fax:  [phone]  Physical Therapy Evaluation  Patient Details  Name: [name] MRN: [id number] Date of Birth: 1965-05-22 Referring Provider (PT): Dr. Barry Dienes   Encounter Date: 04/14/2020   PT End of Session - 04/14/20 1755    Visit Number 1    Number of Visits 8    Date for PT Re-Evaluation 05/12/20    PT Start Time 1503    PT Stop Time 1550    PT Time Calculation (min) 47 min    Activity Tolerance Patient tolerated treatment well    Behavior During Therapy [facility] for tasks assessed/performed           Past Medical History:  Diagnosis Date  . Complication of anesthesia 1997   facial swelling postop due to anesthesia per pt cyst removal from head  . Diabetes mellitus without complication (HCC)    type 2, diet controlled  . High cholesterol   . Hypertension   . Lumbar disc disorder    lower lumbar    Past Surgical History:  Procedure Laterality Date  . BREAST LUMPECTOMY WITH RADIOACTIVE SEED AND SENTINEL LYMPH NODE BIOPSY Left 08/01/2019   Procedure: LEFT BREAST LUMPECTOMY WITH RADIOACTIVE SEED AND SENTINEL LYMPH NODE BIOPSY;  Surgeon: Stark Klein, MD;  Location: [facility];  Service: General;  Laterality: Left;  COMBINE WITH REGIONAL FOR POST OP PAIN  . BREAST SURGERY Bilateral 2019 may   reduction  . BREAST SURGERY Right    for an infection  . CHOLECYSTECTOMY    . CYST EXCISION  1997   from scalp  . DILATION AND CURETTAGE OF UTERUS    . hysterscopy  2014  . LAPAROSCOPIC GASTRIC SLEEVE RESECTION N/A 12/24/2018   Procedure: LAPAROSCOPIC GASTRIC SLEEVE RESECTION, Upper Endo, ERAS Pathway;  Surgeon: Greer Pickerel, MD;  Location: [facility];  Service: General;  Laterality: N/A;    There were no vitals filed for this visit.    Subjective Assessment - 04/14/20 1509    Subjective Had follow up with MD who felt she should come for therapy.  Feels really hard in the Left axillary region and she can't lay on that side for long. Her left shoulder ROM gets achy at times. Might have a little swelling in armpit and lateral left breast.    Pertinent History Screening mammogram detected abnormality and pt subsequently had Left breast lumpectomy with SLNB performed on 08/01/2019.  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  She did have radiation and had 2 LN's removed. She had prior breast reduction surgery bilaterally in 2019    Patient Stated Goals Decrease pain and firmness, check swelling    Currently in Pain? No/denies              [facility] PT Assessment - 04/14/20 0001      Assessment   Medical Diagnosis Left breast cancer    Referring Provider (PT) Dr. Barry Dienes    Onset Date/Surgical Date 08/01/19    Hand Dominance Right    Prior Therapy yes      Precautions   Precaution Comments lymphedema risk      Restrictions   Weight Bearing Restrictions No      Balance Screen   Has the patient fallen in the past 6 months No    Has the patient had a decrease in activity level because of a fear of falling?  No    Is the patient  reluctant to leave their home because of a fear of falling?  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     Posture/Postural Control   Posture/Postural Control Postural limitations    Postural Limitations Rounded  Shoulders;Forward head      AROM   Right Shoulder Extension 65 Degrees    Right Shoulder Flexion 163 Degrees    Right Shoulder ABduction 174 Degrees    Left Shoulder Extension 64 Degrees    Left Shoulder Flexion 163 Degrees    Left Shoulder ABduction 166 Degrees      Palpation   Palpation comment very tender all borders of pec major and pec minor             LYMPHEDEMA/ONCOLOGY QUESTIONNAIRE - 04/14/20 0001      Type   Cancer Type DCIS stage 0      Surgeries   Lumpectomy Date 08/01/19    Sentinel Lymph Node Biopsy Date 08/01/19    Number Lymph Nodes Removed 2      Treatment   Active Chemotherapy Treatment No    Past Chemotherapy Treatment No    Active Radiation Treatment No    Past Radiation Treatment Yes    Body Site axilla    Current Hormone Treatment Yes    Drug Name Tamoxifen    Past Hormone Therapy No      What other symptoms do you have   Are you Having Heaviness or Tightness Yes    Is it Hard or Difficult finding clothes that fit No    Do you have infections Yes   right breast years ago     Right Upper Extremity Lymphedema   15 cm Proximal to Olecranon Process 32.3 cm    10 cm Proximal to Olecranon Process 30.2 cm    Olecranon Process 26.5 cm    15 cm Proximal to Ulnar Styloid Process 23.7 cm    10 cm Proximal to Ulnar Styloid Process 20.3 cm    Just Proximal to Ulnar Styloid Process 15.3 cm    Across Hand at [employer] 20.1 cm    At [place] of 2nd Digit 6.2 cm      Left Upper Extremity Lymphedema   15 cm Proximal to Olecranon Process 32.5 cm    10 cm Proximal to Olecranon Process 31.4 cm    Olecranon Process 26.5 cm    15 cm Proximal to Ulnar Styloid Process 23 cm    10 cm Proximal to Ulnar Styloid Process 19.4 cm    Just Proximal to Ulnar Styloid Process 14.7 cm    Across Hand at [employer] 19.5 cm    At [place] of 2nd Digit 6.2 cm                 Quick Dash - 04/14/20  0001    Open a tight or new jar Mild difficulty    Do heavy  household chores (wash walls, wash floors) No difficulty    Carry a shopping bag or briefcase No difficulty    Wash your back No difficulty    Use a knife to cut food No difficulty    Recreational activities in which you take some force or impact through your arm, shoulder, or hand (golf, hammering, tennis) Mild difficulty    During the past week, to what extent has your arm, shoulder or hand problem interfered with your normal social activities with family, friends, neighbors, or groups? Not at all    During the past week, to what extent has your arm, shoulder or hand problem limited your work or other regular daily activities Not at all    Arm, shoulder, or hand pain. Moderate    Tingling (pins and needles) in your arm, shoulder, or hand Moderate    Difficulty Sleeping Moderate difficulty    DASH Score 18.18 %            Objective measurements completed on examination: See above findings.               PT Education - 04/14/20 1556    Education Details Pt educated in 4 post op exercises for gentle stretching.  Advised to try a sports bra to see if it will help with breast swelling    Person(s) Educated Patient    Methods Explanation;Handout;Demonstration    Comprehension Verbalized understanding;Returned demonstration               PT Long Term Goals - 04/14/20 1802      PT LONG TERM GOAL #1   Title Pt will be independent and compliant with HEP to improve shoulder ROM and strength    Time 4    Period Weeks    Status Achieved    Target Date 05/12/20      PT LONG TERM GOAL #2   Title Pt will have decreased left axillary/shoulder pain by 50%    Time 4    Period Weeks    Status New    Target Date 05/12/20      PT LONG TERM GOAL #3   Title Pt will have minimal difficulty sleeping secondary to minimal pain in left axillary/shoulder region    Time 4    Period Weeks    Status New    Target Date 05/12/20      PT LONG TERM GOAL #4   Title pt will be  independent in Self breast MLD    Time 4    Period Weeks    Status New    Target Date 05/12/20      PT LONG TERM GOAL #5   Title Quick dash will be no greater than 9%    Baseline 18 %    Time 4    Period Weeks    Status New    Target Date 05/12/20                  Plan - 04/14/20 1555    Clinical Impression Statement Pt is seen today for left axillary complaints and possible swelling in the left breast after lumpectomy in July 2021.  She is tender throughout pectoralis major and minor particularly at the axillary border.  She has mildly decreased left shoulder abduction with tightness.  There is some fibrosis noted in the inferior left breast and mild fibrosis  around the incision site.  There is possibly some mild swelling at the left lateral breast.  She will benefit from skilled PT to address tight/tender muscles, improve ROM and to instruct self breast MLD to decrease swelling.    Personal Factors and Comorbidities Comorbidity 2    Comorbidities Left breast Cancer, radiation    Stability/Clinical Decision Making Stable/Uncomplicated    Clinical Decision Making Low    Rehab Potential Excellent    PT Frequency 2x / week    PT Duration 4 weeks    PT Treatment/Interventions ADLs/Self Care Home Management;Therapeutic exercise;Manual techniques;Patient/family education;Manual lymph drainage;Scar mobilization;Passive range of motion    PT Next Visit Plan chip pack? review HEP as needed, pec wall or doorway stretch, STW to pecs prn, instruct left breast MLD, ABC class? See if sports bra helped swelling    PT Home Exercise Plan 4 post op exs with flexion and stargazer in supine    Recommended Other Services ABC class, compression/sports bra    Consulted and Agree with Plan of Care Patient           Patient will benefit from skilled therapeutic intervention in order to improve the following deficits and impairments:  Decreased knowledge of precautions,Decreased range of  motion,Decreased scar mobility,Increased edema,Postural dysfunction,Impaired UE functional use  Visit Diagnosis: Aftercare following surgery for neoplasm  Abnormal posture  Chronic left shoulder pain     Problem List Patient Active Problem List   Diagnosis Date Noted  . Premenstrual symptom 02/27/2020  . 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 Lumbosacral pain, chronic 12/24/2018  . Obesity (BMI 30-39.9) 12/24/2018  . Essential hypertension 12/24/2018  . Diabetes mellitus type 2 in obese ([place]) 12/24/2018  . Hypercholesteremia 12/24/2018  . S/P laparoscopic sleeve gastrectomy 12/24/2018  . Polyp of corpus uteri 04/05/2012    Claris Pong 04/14/2020, 6:11 PM  [place], [place], [address] Phone: [phone]   Fax:  [phone]  Name: [name] MRN: [id number] Date of Birth: 07/16/1965 Cheral Almas, PT 04/14/20 6:13 PM

## 2020-04-19 ENCOUNTER — Ambulatory Visit: Payer: Federal, State, Local not specified - PPO

## 2020-04-27 ENCOUNTER — Other Ambulatory Visit: Payer: Self-pay

## 2020-04-27 ENCOUNTER — Ambulatory Visit: Payer: Federal, State, Local not specified - PPO | Attending: General Surgery | Admitting: Rehabilitation

## 2020-04-27 ENCOUNTER — Encounter: Payer: Self-pay | Admitting: Rehabilitation

## 2020-04-27 DIAGNOSIS — M25512 Pain in left shoulder: Secondary | ICD-10-CM | POA: Insufficient documentation

## 2020-04-27 DIAGNOSIS — R293 Abnormal posture: Secondary | ICD-10-CM

## 2020-04-27 DIAGNOSIS — G8929 Other chronic pain: Secondary | ICD-10-CM | POA: Insufficient documentation

## 2020-04-27 DIAGNOSIS — Z483 Aftercare following surgery for neoplasm: Secondary | ICD-10-CM

## 2020-04-27 NOTE — Patient Instructions (Signed)
Access Code: [email or web address] URL: [email or web address]: 04/05/2022Prepared by: Marcene Brawn TevisExercises  Supine Shoulder Flexion with Dowel - 1 x daily - 7 x weekly - 1-3 sets - 10 reps - 5 seconds hold  Doorway Pec Stretch at 90 Degrees Abduction - 1 x daily - 7 x weekly - 1 sets - 3 reps - 20-30 seconds hold  Child's Pose Stretch - 1 x daily - 7 x weekly - 1 sets - 3 reps - 20-30 seconds hold  Sidelying Thoracic Rotation with Open Book - 1 x daily - 7 x weekly - 1 sets - 5 reps - 5 secon hold  Thoracic Y on Foam Roll - 1 x daily - 7 x weekly - 1 sets - 10 reps - 2 second hold

## 2020-04-27 NOTE — Therapy (Signed)
[place], [place], [address] Phone: [phone]   Fax:  [phone]  Physical Therapy Treatment  Patient Details  Name: [name] MRN: [id number] Date of Birth: 02/21/1965 Referring Provider (PT): Dr. Barry Dienes   Encounter Date: 04/27/2020   PT End of Session - 04/27/20 1446    Visit Number 2    Number of Visits 8    Date for PT Re-Evaluation 05/12/20    PT Start Time 1400    PT Stop Time 1440    PT Time Calculation (min) 40 min    Activity Tolerance Patient tolerated treatment well    Behavior During Therapy [facility] for tasks assessed/performed           Past Medical History:  Diagnosis Date  . Complication of anesthesia 1997   facial swelling postop due to anesthesia per pt cyst removal from head  . Diabetes mellitus without complication (HCC)    type 2, diet controlled  . High cholesterol   . Hypertension   . Lumbar disc disorder    lower lumbar    Past Surgical History:  Procedure Laterality Date  . BREAST LUMPECTOMY WITH RADIOACTIVE SEED AND SENTINEL LYMPH NODE BIOPSY Left 08/01/2019   Procedure: LEFT BREAST LUMPECTOMY WITH RADIOACTIVE SEED AND SENTINEL LYMPH NODE BIOPSY;  Surgeon: Stark Klein, MD;  Location: [facility];  Service: General;  Laterality: Left;  COMBINE WITH REGIONAL FOR POST OP PAIN  . BREAST SURGERY Bilateral 2019 may   reduction  . BREAST SURGERY Right    for an infection  . CHOLECYSTECTOMY    . CYST EXCISION  1997   from scalp  . DILATION AND CURETTAGE OF UTERUS    . hysterscopy  2014  . LAPAROSCOPIC GASTRIC SLEEVE RESECTION N/A 12/24/2018   Procedure: LAPAROSCOPIC GASTRIC SLEEVE RESECTION, Upper Endo, ERAS Pathway;  Surgeon: Greer Pickerel, MD;  Location: [facility];  Service: General;  Laterality: N/A;    There were no vitals filed for this visit.   Subjective Assessment - 04/27/20 1358    Subjective Nothing new.    Pertinent History Screening mammogram detected  abnormality and pt subsequently had Left breast lumpectomy with SLNB performed on 08/01/2019.  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  She did have radiation and had 2 LN's removed. She had prior breast reduction surgery bilaterally in 2019    Currently in Pain? No/denies                             [facility] Adult PT Treatment/Exercise - 04/27/20 0001      Exercises   Exercises Shoulder      Shoulder Exercises: Supine   Flexion Both;5 reps    Flexion Limitations with dowel 6" hold each    Diagonals Left;10 reps    Diagonals Limitations AROM for active motion stretch; feeling more than elbow bent    Other Supine Exercises behind the head stretch easy      Shoulder Exercises: Prone   Other Prone Exercises childs pose x 30"      Shoulder Exercises: Sidelying   Other Sidelying Exercises open book 5"x5      Shoulder Exercises: Stretch   Corner Stretch 2 reps;30 seconds    Corner Stretch Limitations doorway double arm at 90deg and single arm overhead    Other Shoulder Stretches --      Manual Therapy   Manual Therapy Myofascial release;Soft tissue mobilization;Passive ROM  Soft tissue mobilization to the left axilla, pectoralis, latissimus    Myofascial Release to the left axilla with cross hands in overhead position and in flexion    Passive ROM to the left shoulder flexion, abduction, ER, and horizontal abduction                  PT Education - 04/27/20 1445    Education Details updated HEP    Person(s) Educated Patient    Methods Explanation;Demonstration;Tactile cues;Verbal cues;Handout    Comprehension Verbalized understanding;Returned demonstration;Verbal cues required;Tactile cues required               PT Long Term Goals - 04/14/20 1802      PT LONG TERM GOAL #1   Title Pt will be independent and compliant with HEP to improve shoulder ROM and strength    Time 4    Period Weeks    Status Achieved    Target Date 05/12/20      PT LONG TERM GOAL  #2   Title Pt will have decreased left axillary/shoulder pain by 50%    Time 4    Period Weeks    Status New    Target Date 05/12/20      PT LONG TERM GOAL #3   Title Pt will have minimal difficulty sleeping secondary to minimal pain in left axillary/shoulder region    Time 4    Period Weeks    Status New    Target Date 05/12/20      PT LONG TERM GOAL #4   Title pt will be independent in Self breast MLD    Time 4    Period Weeks    Status New    Target Date 05/12/20      PT LONG TERM GOAL #5   Title Quick dash will be no greater than 9%    Baseline 18 %    Time 4    Period Weeks    Status New    Target Date 05/12/20                 Plan - 04/27/20 1446    Clinical Impression Statement Pt demonstrated tightness in the pectoralis and axilla especially with straight arm abduction and diagonal straight arm movements.  Pt loosened up quickly today with STM and was able to advance stretches past post op 4.  Some mild puffiness supraclavicular region and lateral trunk/breast next to reduction incision.    PT Frequency 2x / week    PT Duration 4 weeks    PT Treatment/Interventions ADLs/Self Care Home Management;Therapeutic exercise;Manual techniques;Patient/family education;Manual lymph drainage;Scar mobilization;Passive range of motion    PT Next Visit Plan chip pack? STW to pecs and axilla, see if breast MLD is needed vs lateral only, continue stretches    PT Home Exercise Plan [email or web address]    Consulted and Agree with Plan of Care Patient           Patient will benefit from skilled therapeutic intervention in order to improve the following deficits and impairments:     Visit Diagnosis: Aftercare following surgery for neoplasm  Abnormal posture  Chronic left shoulder pain     Problem List Patient Active Problem List   Diagnosis Date Noted  . Premenstrual symptom 02/27/2020  . 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 Lumbosacral pain,  chronic 12/24/2018  . Obesity (BMI 30-39.9) 12/24/2018  . Essential hypertension 12/24/2018  . Diabetes mellitus type 2 in obese (  [place]) 12/24/2018  . Hypercholesteremia 12/24/2018  . S/P laparoscopic sleeve gastrectomy 12/24/2018  . Polyp of corpus uteri 04/05/2012    Stark Bray 04/27/2020, 2:50 PM  [place], [place], [address] Phone: [phone]   Fax:  [phone]  Name: [name] MRN: [id number] Date of Birth: 1965-04-03

## 2020-04-28 DIAGNOSIS — K080 Exfoliation of teeth due to systemic causes: Secondary | ICD-10-CM | POA: Diagnosis not present

## 2020-04-29 ENCOUNTER — Ambulatory Visit: Payer: Federal, State, Local not specified - PPO | Admitting: Rehabilitation

## 2020-04-29 ENCOUNTER — Encounter: Payer: Self-pay | Admitting: Rehabilitation

## 2020-04-29 ENCOUNTER — Other Ambulatory Visit: Payer: Self-pay

## 2020-04-29 DIAGNOSIS — R293 Abnormal posture: Secondary | ICD-10-CM

## 2020-04-29 DIAGNOSIS — Z483 Aftercare following surgery for neoplasm: Secondary | ICD-10-CM

## 2020-04-29 DIAGNOSIS — G8929 Other chronic pain: Secondary | ICD-10-CM

## 2020-04-29 DIAGNOSIS — M25512 Pain in left shoulder: Secondary | ICD-10-CM | POA: Diagnosis not present

## 2020-04-29 NOTE — Therapy (Signed)
[place], [place], [address] Phone: [phone]   Fax:  [phone]  Physical Therapy Treatment  Patient Details  Name: [name] MRN: [id number] Date of Birth: 04-29-1965 Referring Provider (PT): Dr. Barry Dienes   Encounter Date: 04/29/2020   PT End of Session - 04/29/20 1447    Visit Number 3    Number of Visits 8    Date for PT Re-Evaluation 05/12/20    PT Start Time 1400    PT Stop Time 1445    PT Time Calculation (min) 45 min    Activity Tolerance Patient tolerated treatment well    Behavior During Therapy [facility] for tasks assessed/performed           Past Medical History:  Diagnosis Date  . Complication of anesthesia 1997   facial swelling postop due to anesthesia per pt cyst removal from head  . Diabetes mellitus without complication (HCC)    type 2, diet controlled  . High cholesterol   . Hypertension   . Lumbar disc disorder    lower lumbar    Past Surgical History:  Procedure Laterality Date  . BREAST LUMPECTOMY WITH RADIOACTIVE SEED AND SENTINEL LYMPH NODE BIOPSY Left 08/01/2019   Procedure: LEFT BREAST LUMPECTOMY WITH RADIOACTIVE SEED AND SENTINEL LYMPH NODE BIOPSY;  Surgeon: Stark Klein, MD;  Location: [facility];  Service: General;  Laterality: Left;  COMBINE WITH REGIONAL FOR POST OP PAIN  . BREAST SURGERY Bilateral 2019 may   reduction  . BREAST SURGERY Right    for an infection  . CHOLECYSTECTOMY    . CYST EXCISION  1997   from scalp  . DILATION AND CURETTAGE OF UTERUS    . hysterscopy  2014  . LAPAROSCOPIC GASTRIC SLEEVE RESECTION N/A 12/24/2018   Procedure: LAPAROSCOPIC GASTRIC SLEEVE RESECTION, Upper Endo, ERAS Pathway;  Surgeon: Greer Pickerel, MD;  [facility];  Service: General;  Laterality: N/A;    There were no vitals filed for this visit.   Subjective Assessment - 04/29/20 1359    Subjective It felt good to work on it    Pertinent History Screening mammogram  detected abnormality and pt subsequently had Left breast lumpectomy with SLNB performed on 08/01/2019.  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  She did have radiation and had 2 LN's removed. She had prior breast reduction surgery bilaterally in 2019    Patient Stated Goals Decrease pain and firmness, check swelling    Currently in Pain? No/denies                             [facility] Adult PT Treatment/Exercise - 04/29/20 0001      Shoulder Exercises: Pulleys   Flexion 2 minutes    Flexion Limitations with cueing for initial performance    ABduction 2 minutes    ABduction Limitations with cueing for initial performance      Shoulder Exercises: Therapy Ball   Flexion Both;5 reps    ABduction Left;5 reps      Shoulder Exercises: Stretch   Corner Stretch 2 reps;20 seconds    Corner Stretch Limitations single arm overhead      Manual Therapy   Manual Therapy Manual Lymphatic Drainage (MLD)    Soft tissue mobilization to the left axilla, pectoralis, latissimus    Myofascial Release to the left axilla with cross hands in overhead position and in flexion    Manual Lymphatic Drainage (MLD)  post STM short neck, left axillary and inguinal nodes, axillo inguina lpathway and then axillary and lateral breast work and then reversing all steps    Passive ROM to the left shoulder flexion, abduction, ER, and horizontal abduction                       PT Long Term Goals - 04/14/20 1802      PT LONG TERM GOAL #1   Title Pt will be independent and compliant with HEP to improve shoulder ROM and strength    Time 4    Period Weeks    Status Achieved    Target Date 05/12/20      PT LONG TERM GOAL #2   Title Pt will have decreased left axillary/shoulder pain by 50%    Time 4    Period Weeks    Status New    Target Date 05/12/20      PT LONG TERM GOAL #3   Title Pt will have minimal difficulty sleeping secondary to minimal pain in left axillary/shoulder region    Time 4     Period Weeks    Status New    Target Date 05/12/20      PT LONG TERM GOAL #4   Title pt will be independent in Self breast MLD    Time 4    Period Weeks    Status New    Target Date 05/12/20      PT LONG TERM GOAL #5   Title Quick dash will be no greater than 9%    Baseline 18 %    Time 4    Period Weeks    Status New    Target Date 05/12/20                 Plan - 04/29/20 1447    Clinical Impression Statement tightness less today in the pectoralis and axilla and loosening even more during treatment.  Axillary congestion seeming to disperse well with MLD today post STM.  Pt continues with axillary pull with overhead abduction but overall is doing very well.    PT Frequency 2x / week    PT Duration 4 weeks    PT Treatment/Interventions ADLs/Self Care Home Management;Therapeutic exercise;Manual techniques;Patient/family education;Manual lymph drainage;Scar mobilization;Passive range of motion    PT Next Visit Plan chip pack? STW to pecs and axilla, see if breast MLD is needed vs lateral only, continue stretches    Consulted and Agree with Plan of Care Patient           Patient will benefit from skilled therapeutic intervention in order to improve the following deficits and impairments:     Visit Diagnosis: Aftercare following surgery for neoplasm  Abnormal posture  Chronic left shoulder pain     Problem List Patient Active Problem List   Diagnosis Date Noted  . Premenstrual symptom 02/27/2020  . 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 Lumbosacral pain, chronic 12/24/2018  . Obesity (BMI 30-39.9) 12/24/2018  . Essential hypertension 12/24/2018  . Diabetes mellitus type 2 in obese ([place]) 12/24/2018  . Hypercholesteremia 12/24/2018  . S/P laparoscopic sleeve gastrectomy 12/24/2018  . Polyp of corpus uteri 04/05/2012    Stark Bray 04/29/2020, 2:49 PM  [place], [place], [address] Phone: [phone]   Fax:  [phone]  Name: THURMA PRIEGO MRN: [id number] Date of Birth: 11-11-1965

## 2020-05-01 ENCOUNTER — Other Ambulatory Visit: Payer: Self-pay | Admitting: General Surgery

## 2020-05-01 DIAGNOSIS — Z170 Estrogen receptor positive status [ER+]: Secondary | ICD-10-CM

## 2020-05-01 DIAGNOSIS — C50412 Malignant neoplasm of upper-outer quadrant of left female breast: Secondary | ICD-10-CM

## 2020-05-04 ENCOUNTER — Ambulatory Visit: Payer: Federal, State, Local not specified - PPO

## 2020-05-04 ENCOUNTER — Other Ambulatory Visit: Payer: Self-pay

## 2020-05-04 DIAGNOSIS — Z483 Aftercare following surgery for neoplasm: Secondary | ICD-10-CM

## 2020-05-04 DIAGNOSIS — R293 Abnormal posture: Secondary | ICD-10-CM | POA: Diagnosis not present

## 2020-05-04 DIAGNOSIS — G8929 Other chronic pain: Secondary | ICD-10-CM

## 2020-05-04 DIAGNOSIS — M25512 Pain in left shoulder: Secondary | ICD-10-CM | POA: Diagnosis not present

## 2020-05-04 NOTE — Therapy (Signed)
[place], [place], [address] Phone: [phone]   Fax:  [phone]  Physical Therapy Treatment  Patient Details  Name: [name] MRN: [id number] Date of Birth: [birth year] Referring Provider (PT): Dr. Barry Dienes   Encounter Date: 05/04/2020   PT End of Session - 05/04/20 1329    Visit Number 4    Number of Visits 8    Date for PT Re-Evaluation 05/12/20    PT Start Time 0938    PT Stop Time 1355    PT Time Calculation (min) 49 min    Activity Tolerance Patient tolerated treatment well    Behavior During Therapy [facility] for tasks assessed/performed           Past Medical History:  Diagnosis Date  . Complication of anesthesia 1997   facial swelling postop due to anesthesia per pt cyst removal from head  . Diabetes mellitus without complication (HCC)    type 2, diet controlled  . High cholesterol   . Hypertension   . Lumbar disc disorder    lower lumbar    Past Surgical History:  Procedure Laterality Date  . BREAST LUMPECTOMY WITH RADIOACTIVE SEED AND SENTINEL LYMPH NODE BIOPSY Left 08/01/2019   Procedure: LEFT BREAST LUMPECTOMY WITH RADIOACTIVE SEED AND SENTINEL LYMPH NODE BIOPSY;  Surgeon: Stark Klein, MD;  Location: [facility];  Service: General;  Laterality: Left;  COMBINE WITH REGIONAL FOR POST OP PAIN  . BREAST SURGERY Bilateral 2019 may   reduction  . BREAST SURGERY Right    for an infection  . CHOLECYSTECTOMY    . CYST EXCISION  1997   from scalp  . DILATION AND CURETTAGE OF UTERUS    . hysterscopy  2014  . LAPAROSCOPIC GASTRIC SLEEVE RESECTION N/A 12/24/2018   Procedure: LAPAROSCOPIC GASTRIC SLEEVE RESECTION, Upper Endo, ERAS Pathway;  Surgeon: Greer Pickerel, MD;  [facility];  Service: General;  Laterality: N/A;    There were no vitals filed for this visit.   Subjective Assessment - 05/04/20 1305    Subjective Feeling pretty good overall. ROM is improving. Less pain to lay on  that side, but still can't sleep on that side. No problems with dressing or bathing.    Pertinent History Screening mammogram detected abnormality and pt subsequently had Left breast lumpectomy with SLNB performed on 08/01/2019.  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  She did have radiation and had 2 LN's removed. She had prior breast reduction surgery bilaterally in 2019    Patient Stated Goals Decrease pain and firmness, check swelling    Currently in Pain? No/denies    Multiple Pain Sites No                             [facility] Adult PT Treatment/Exercise - 05/04/20 0001      Shoulder Exercises: Supine   Horizontal ABduction Strengthening;Both;10 reps    Theraband Level (Shoulder Horizontal ABduction) Level 1 (Yellow)    External Rotation Strengthening;Both;10 reps    Theraband Level (Shoulder External Rotation) Level 1 (Yellow)    Flexion Strengthening;Both;10 reps    Theraband Level (Shoulder Flexion) Level 1 (Yellow)    Diagonals Strengthening;Both;10 reps    Theraband Level (Shoulder Diagonals) Level 1 (Yellow)    Other Supine Exercises AROM, and horizontal abd x 5 flex, scaption      Manual Therapy   Manual Therapy Manual Lymphatic Drainage (MLD)    Soft  tissue mobilization to the left axilla, pectoralis, latissimus    Manual Lymphatic Drainage (MLD) Pt instructed in short neck, bilateral axillary and left inguinal nodes, axillo inguina lpathway, interaxillary pathway and then left breast/lateral trunk work and then reversing all steps    Passive ROM to the left shoulder flexion, abduction, ER, and horizontal abduction                  PT Education - 05/04/20 1328    Education Details Pt was edcated in supine scapular series with yellow band x 10 ea, and self left Breast MLD    Person(s) Educated Patient    Methods Explanation;Handout;Demonstration    Comprehension Returned demonstration               PT Long Term Goals - 04/14/20 1802      PT LONG  TERM GOAL #1   Title Pt will be independent and compliant with HEP to improve shoulder ROM and strength    Time 4    Period Weeks    Status Achieved    Target Date 05/12/20      PT LONG TERM GOAL #2   Title Pt will have decreased left axillary/shoulder pain by 50%    Time 4    Period Weeks    Status New    Target Date 05/12/20      PT LONG TERM GOAL #3   Title Pt will have minimal difficulty sleeping secondary to minimal pain in left axillary/shoulder region    Time 4    Period Weeks    Status New    Target Date 05/12/20      PT LONG TERM GOAL #4   Title pt will be independent in Self breast MLD    Time 4    Period Weeks    Status New    Target Date 05/12/20      PT LONG TERM GOAL #5   Title Quick dash will be no greater than 9%    Baseline 18 %    Time 4    Period Weeks    Status New    Target Date 05/12/20                 Plan - 05/04/20 1330    Clinical Impression Statement Pt with excellent improvement in shoulder ROM with only mild tightness in left axillary region.  Pt did very well with supine scapular series and it was aded to HEP.  There was some mild firmness in left breast and pt was instructed in self MLD.  she did exceptionally well and was given written instructions.  chip pack was made for incisional area and pt will try with a sports bra.    Personal Factors and Comorbidities Comorbidity 2    Comorbidities Left breast Cancer, radiation    Stability/Clinical Decision Making Stable/Uncomplicated    Rehab Potential Excellent    PT Frequency 2x / week    PT Duration 4 weeks    PT Treatment/Interventions ADLs/Self Care Home Management;Therapeutic exercise;Manual techniques;Patient/family education;Manual lymph drainage;Scar mobilization;Passive range of motion    PT Next Visit Plan chip pack; how did it do? STW to pecs and axilla, Review breast MLD, Check goals, continue stretches    PT Home Exercise Plan [id number], supine scap. series, left breast MLD     Consulted and Agree with Plan of Care Patient           Patient will benefit from skilled therapeutic intervention in  order to improve the following deficits and impairments:  Decreased knowledge of precautions,Decreased range of motion,Decreased scar mobility,Increased edema,Postural dysfunction,Impaired UE functional use  Visit Diagnosis: Aftercare following surgery for neoplasm  Abnormal posture  Chronic left shoulder pain     Problem List Patient Active Problem List   Diagnosis Date Noted  . Premenstrual symptom 02/27/2020  . 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 Lumbosacral pain, chronic 12/24/2018  . Obesity (BMI 30-39.9) 12/24/2018  . Essential hypertension 12/24/2018  . Diabetes mellitus type 2 in obese ([place]) 12/24/2018  . Hypercholesteremia 12/24/2018  . S/P laparoscopic sleeve gastrectomy 12/24/2018  . Polyp of corpus uteri 04/05/2012    Claris Pong 05/04/2020, 2:02 PM  [place], [place], [address] Phone: [phone]   Fax:  [phone]  Name: [name] MRN: [id number] Date of Birth: 04-23-1965  Cheral Almas, PT 05/04/20 2:03 PM

## 2020-05-04 NOTE — Patient Instructions (Addendum)
Over Head Pull: Narrow and Wide Grip   Cancer Rehab [phone]   On back, knees bent, feet flat, band across thighs, elbows straight but relaxed. Pull hands apart (start). Keeping elbows straight, bring arms up and over head, hands toward floor. Keep pull steady on band. Hold momentarily. Return slowly, keeping pull steady, back to start. Then do same with a wider grip on the band (past shoulder width) Repeat _5-10__ times. Band color __yellow____   Side Pull: Double Arm   On back, knees bent, feet flat. Arms perpendicular to body, shoulder level, elbows straight but relaxed. Pull arms out to sides, elbows straight. Resistance band comes across collarbones, hands toward floor. Hold momentarily. Slowly return to starting position. Repeat _5-10__ times. Band color _yellow____   Sword   On back, knees bent, feet flat, left hand on left hip, right hand above left. Pull right arm DIAGONALLY (hip to shoulder) across chest. Bring right arm along head toward floor. Hold momentarily. Slowly return to starting position. Repeat _5-10__ times. Do with left arm. Band color _yellow_____   Shoulder Rotation: Double Arm   On back, knees bent, feet flat, elbows tucked at sides, bent 90, hands palms up. Pull hands apart and down toward floor, keeping elbows near sides. Hold momentarily. Slowly return to starting position. Repeat _5-10__ times. Band color __yellow____   Manual Lymph Drainage for Left Breast.  Do daily.  Do slowly. Use flat hands with just enough pressure to stretch the skin. Do not slide over the skin, but move the skin with the hand you're using. Lie down or sit comfortably (in a recliner, for example) to do this.  1) Hug yourself:  cross arms and do circles at collar bones near neck 5-7 times (to "wake up" lots of lymph nodes in this area). 2) Take slow deep breaths, allowing your belly to balloon out as your breathe in, 5x (to "wake up" abdominal lymph nodes to take on extra  fluid). 3) Right armpit--stretch skin in small circles to stimulate intact lymph nodes there, 5-7x. 4) Left groin area, at panty line--stretch skin in small circles to stimulate lymph nodes 5-7x. 5) Redirect fluid from left chest toward right armpit (stretch skin starting at left chest in 3-4 spots working toward right armpit) 3-4x across the chest. 6) Redirect fluid from left armpit toward left groin (cup your hand around the curve of your left side and do 3-4 "pumps" from armpit to groin) 3-4x down your side. 7) Draw an imaginary diagonal line from upper outer breast through the nipple area toward lower inner breast.  Direct fluid upward and inward from this line toward the pathway across your upper chest (established in #5).  Do this in three rows to treat all of the upper inner breast tissue, and do each row 3-4x. 8) Then repeat #5 above. 9) Direct fluid to treat all of lower outer breast tissue downward and outward toward pathway established in #6 that is aimed at the left groin. 10)  Then repeat #6 above. 11)  End with repeating #3 and #4 above.   [facility] [address], [place]   [address] [phone]

## 2020-05-06 ENCOUNTER — Ambulatory Visit: Payer: Federal, State, Local not specified - PPO | Admitting: Rehabilitation

## 2020-05-06 ENCOUNTER — Other Ambulatory Visit: Payer: Self-pay

## 2020-05-06 ENCOUNTER — Encounter: Payer: Self-pay | Admitting: Rehabilitation

## 2020-05-06 DIAGNOSIS — R293 Abnormal posture: Secondary | ICD-10-CM

## 2020-05-06 DIAGNOSIS — G8929 Other chronic pain: Secondary | ICD-10-CM | POA: Diagnosis not present

## 2020-05-06 DIAGNOSIS — M25512 Pain in left shoulder: Secondary | ICD-10-CM | POA: Diagnosis not present

## 2020-05-06 DIAGNOSIS — Z483 Aftercare following surgery for neoplasm: Secondary | ICD-10-CM | POA: Diagnosis not present

## 2020-05-06 NOTE — Therapy (Addendum)
[place], [place], [address] Phone: [phone]   Fax:  [phone]  Physical Therapy Treatment  Patient Details  Name: [name] MRN: [id number] Date of Birth: [birth year] Referring Provider (PT): Dr. Barry Dienes   Encounter Date: 05/06/2020   PT End of Session - 05/06/20 1435    Visit Number 5    Number of Visits 8    Date for PT Re-Evaluation 05/12/20    PT Start Time 1404    PT Stop Time 1434    PT Time Calculation (min) 30 min    Activity Tolerance Patient tolerated treatment well    Behavior During Therapy [facility] for tasks assessed/performed           Past Medical History:  Diagnosis Date  . Complication of anesthesia 1997   facial swelling postop due to anesthesia per pt cyst removal from head  . Diabetes mellitus without complication (HCC)    type 2, diet controlled  . High cholesterol   . Hypertension   . Lumbar disc disorder    lower lumbar    Past Surgical History:  Procedure Laterality Date  . BREAST LUMPECTOMY WITH RADIOACTIVE SEED AND SENTINEL LYMPH NODE BIOPSY Left 08/01/2019   Procedure: LEFT BREAST LUMPECTOMY WITH RADIOACTIVE SEED AND SENTINEL LYMPH NODE BIOPSY;  Surgeon: Stark Klein, MD;  Location: [facility];  Service: General;  Laterality: Left;  COMBINE WITH REGIONAL FOR POST OP PAIN  . BREAST SURGERY Bilateral 2019 may   reduction  . BREAST SURGERY Right    for an infection  . CHOLECYSTECTOMY    . CYST EXCISION  1997   from scalp  . DILATION AND CURETTAGE OF UTERUS    . hysterscopy  2014  . LAPAROSCOPIC GASTRIC SLEEVE RESECTION N/A 12/24/2018   Procedure: LAPAROSCOPIC GASTRIC SLEEVE RESECTION, Upper Endo, ERAS Pathway;  Surgeon: Greer Pickerel, MD;  [facility];  Service: General;  Laterality: N/A;    There were no vitals filed for this visit.   Subjective Assessment - 05/06/20 1404    Subjective The pain is subsiding.  The chip pack is doing well.  I feel good  with the massage.    Pertinent History Screening mammogram detected abnormality and pt subsequently had Left breast lumpectomy with SLNB performed on 08/01/2019.  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  She did have radiation and had 2 LN's removed. She had prior breast reduction surgery bilaterally in 2019    Currently in Pain? No/denies              [facility] PT Assessment - 05/06/20 0001      ROM / Strength   AROM / PROM / Strength Strength      AROM   Left Shoulder ABduction 175 Degrees      Strength   Overall Strength Comments --    Strength Assessment Site Shoulder    Right/Left Shoulder Right;Left                 Quick Dash - 05/06/20 0001    Open a tight or new jar No difficulty    Do heavy household chores (wash walls, wash floors) No difficulty    Carry a shopping bag or briefcase No difficulty    Wash your back No difficulty    Use a knife to cut food No difficulty    Recreational activities in which you take some force or impact through your arm, shoulder, or hand (golf, hammering, tennis) No  difficulty    During the past week, to what extent has your arm, shoulder or hand problem interfered with your normal social activities with family, friends, neighbors, or groups? Not at all    During the past week, to what extent has your arm, shoulder or hand problem limited your work or other regular daily activities Not at all    Arm, shoulder, or hand pain. Mild    Tingling (pins and needles) in your arm, shoulder, or hand None    Difficulty Sleeping Mild difficulty    DASH Score 4.55 %                  [facility] Adult PT Treatment/Exercise - 05/06/20 0001      Shoulder Exercises: Supine   Horizontal ABduction Strengthening;Both;10 reps    Theraband Level (Shoulder Horizontal ABduction) Level 1 (Yellow)    External Rotation Strengthening;Both;10 reps    Theraband Level (Shoulder External Rotation) Level 1 (Yellow)    Flexion Strengthening;Both;10 reps    Theraband  Level (Shoulder Flexion) Level 1 (Yellow)    Flexion Limitations with dowel 6" hold each x 10    Diagonals Strengthening;Both;10 reps      Shoulder Exercises: Standing   Row Both;15 reps;Theraband    Theraband Level (Shoulder Row) Level 2 (Red)      Shoulder Exercises: Pulleys   Flexion 2 minutes   overall easy   ABduction 2 minutes   easy     Manual Therapy   Manual therapy comments reviewed goals / ROM testing x 53mn                       PT Long Term Goals - 05/06/20 1409      PT LONG TERM GOAL #1   Title Pt will be independent and compliant with HEP to improve shoulder ROM and strength      PT LONG TERM GOAL #2   Title Pt will have decreased left axillary/shoulder pain by 50%    Baseline overall no pain; only tender to poke the axilla    Status Achieved      PT LONG TERM GOAL #3   Title Pt will have minimal difficulty sleeping secondary to minimal pain in left axillary/shoulder region    Baseline still unable to lie on that side; does not wake up because of pain      PT LONG TERM GOAL #4   Title pt will be independent in Self breast MLD    Status Achieved      PT LONG TERM GOAL #5   Title Quick dash will be no greater than 9%    Baseline 4%    Status Achieved                 Plan - 05/06/20 1436    Clinical Impression Statement All goals have been met with pt not reporting much stretch with all PT activities.  Pt is now ind with self care and knows when and why to return to PT in the future.    Consulted and Agree with Plan of Care Patient           Patient will benefit from skilled therapeutic intervention in order to improve the following deficits and impairments:     Visit Diagnosis: Aftercare following surgery for neoplasm  Abnormal posture  Chronic left shoulder pain     Problem List Patient Active Problem List   Diagnosis Date Noted  . Premenstrual symptom  02/27/2020  . Ductal carcinoma in situ (DCIS) of left breast  07/11/2019  . Lumbosacral pain, chronic 12/24/2018  . Obesity (BMI 30-39.9) 12/24/2018  . Essential hypertension 12/24/2018  . Diabetes mellitus type 2 in obese ([place]) 12/24/2018  . Hypercholesteremia 12/24/2018  . S/P laparoscopic sleeve gastrectomy 12/24/2018  . Polyp of corpus uteri 04/05/2012    Stark Bray 05/06/2020, 2:37 PM  [place], [place], [address] Phone: [phone]   Fax:  [phone]  Name: [name] MRN: [id number] Date of Birth: 1965-03-15  PHYSICAL THERAPY DISCHARGE SUMMARY  Visits from Start of Care: 5  Current functional level related to goals / functional outcomes: See above   Remaining deficits: Doing well currently; lymphedema lifelong risk   Education / Equipment: HEP Plan: Patient agrees to discharge.  Patient goals were met. Patient is being discharged due to meeting the stated rehab goals.  ?????    Shan Levans, PT

## 2020-05-10 ENCOUNTER — Ambulatory Visit: Payer: Federal, State, Local not specified - PPO | Admitting: Physical Therapy

## 2020-05-12 ENCOUNTER — Encounter: Payer: Federal, State, Local not specified - PPO | Admitting: Physical Therapy

## 2020-05-17 DIAGNOSIS — E119 Type 2 diabetes mellitus without complications: Secondary | ICD-10-CM | POA: Diagnosis not present

## 2020-05-17 DIAGNOSIS — Z0000 Encounter for general adult medical examination without abnormal findings: Secondary | ICD-10-CM | POA: Diagnosis not present

## 2020-05-17 DIAGNOSIS — I10 Essential (primary) hypertension: Secondary | ICD-10-CM | POA: Diagnosis not present

## 2020-05-17 DIAGNOSIS — E782 Mixed hyperlipidemia: Secondary | ICD-10-CM | POA: Diagnosis not present

## 2020-05-18 DIAGNOSIS — Z1211 Encounter for screening for malignant neoplasm of colon: Secondary | ICD-10-CM | POA: Diagnosis not present

## 2020-06-23 DIAGNOSIS — Z853 Personal history of malignant neoplasm of breast: Secondary | ICD-10-CM | POA: Diagnosis not present

## 2020-07-14 ENCOUNTER — Encounter (HOSPITAL_COMMUNITY): Payer: Self-pay | Admitting: *Deleted

## 2020-08-02 ENCOUNTER — Encounter (HOSPITAL_COMMUNITY): Payer: Self-pay

## 2020-08-31 NOTE — Progress Notes (Signed)
Patient Care Team: Maurice Small, MD as PCP - General (Family Medicine) Stark Klein, MD as Consulting Physician (General Surgery) Nicholas Lose, MD as Consulting Physician (Hematology and Oncology) Kyung Rudd, MD as Consulting Physician (Radiation Oncology)  DIAGNOSIS:    ICD-10-CM   1. Ductal carcinoma in situ (DCIS) of left breast  D05.12       SUMMARY OF ONCOLOGIC HISTORY: Oncology History  Ductal carcinoma in situ (DCIS) of left breast  07/11/2019 Initial Diagnosis   Screening mammogram showed indeterminate left breast calcifications, 2.0cm, 12:30 position. Biopsy showed DCIS, grade 2, ER+ 95%, PR+ 20%.    08/01/2019 Surgery   Left lumpectomy Barry Dienes) [phone]): high grade DCIS with necrosis, ER 95%, PR 20%. 2 regional lymph nodes were negative for carcinoma. Clear margins.   09/08/2019 - 10/27/2019 Radiation Therapy   The patient initially received a dose of 50.4 Gy in 28 fractions to the breast using whole-breast tangent fields. This was delivered using a 3-D conformal technique. The patient then received a boost to the seroma. This delivered an additional 10 Gy in 5 fractions using a 3 field photon boost technique. The total dose was 60.4 Gy.   10/2019 - 10/2024 Anti-estrogen oral therapy   Tamoxifen   01/31/2020 Cancer Staging   Staging form: Breast, AJCC 8th Edition - Pathologic: Stage 0 (pTis (DCIS), pN0, cM0, ER+, PR+)     CHIEF COMPLIANT: Follow-up of left breast DCIS to discuss antiestrogen therapy  INTERVAL HISTORY: [name] is a 55 y.o. with above-mentioned history of  left breast DCIS who underwent a left lumpectomy and radiation, currently on antiestrogen therapy with tamoxifen. She presents to the clinic today for follow-up.  Her major complaint today is severe hot flashes which keep her up at night and affecting her quality of life.  ALLERGIES:  has No Known Allergies.  MEDICATIONS:  Current Outpatient Medications  Medication Sig Dispense Refill    ascorbic acid (VITAMIN C) 500 MG tablet Take 500 mg by mouth daily.     CALCIUM PO Take 1,200 mg by mouth daily.      FLUoxetine (PROZAC) 40 MG capsule Take 40 mg by mouth daily.     gabapentin (NEURONTIN) 100 MG capsule Take 100 mg by mouth 3 (three) times daily.     hydrochlorothiazide (HYDRODIURIL) 12.5 MG tablet Take 12.5 mg by mouth daily.     levonorgestrel (MIRENA) 20 MCG/24HR IUD 1 each by Intrauterine route once. Inserted 2017     Multiple Vitamin (MULTIVITAMIN WITH MINERALS) TABS tablet Take 1 tablet by mouth daily.     tamoxifen (NOLVADEX) 20 MG tablet Take 1 tablet (20 mg total) by mouth daily. 90 tablet 3   No current facility-administered medications for this visit.    PHYSICAL EXAMINATION: ECOG PERFORMANCE STATUS: 1 - Symptomatic but completely ambulatory  Vitals:   09/01/20 1039  BP: 130/72  Pulse: 68  Resp: 19  Temp: 97.7 F (36.5 C)  SpO2: 99%   Filed Weights   09/01/20 1039  Weight: 175 lb 11.2 oz (79.7 kg)    BREAST: No palpable masses or nodules in either right or left breasts. No palpable axillary supraclavicular or infraclavicular adenopathy no breast tenderness or nipple discharge. (exam performed in the presence of a chaperone)  LABORATORY DATA:  I have reviewed the data as listed CMP Latest Ref Rng & Units 07/24/2019 07/16/2019 12/25/2018  Glucose 70 - 99 mg/dL 90 98 129(H)  BUN 6 - 20 mg/dL '19 17 14  '$ Creatinine 0.44 -  1.00 mg/dL 0.81 0.86 0.65  Sodium 135 - 145 mmol/L 137 137 135  Potassium 3.5 - 5.1 mmol/L 4.4 4.6 3.9  Chloride 98 - 111 mmol/L 101 106 102  CO2 22 - 32 mmol/L '27 24 23  '$ Calcium 8.9 - 10.3 mg/dL 9.4 9.4 8.6(L)  Total Protein 6.5 - 8.1 g/dL - 7.4 7.1  Total Bilirubin 0.3 - 1.2 mg/dL - 0.4 0.6  Alkaline Phos 38 - 126 U/L - 70 65  AST 15 - 41 U/L - 24 29  ALT 0 - 44 U/L - 28 34    Lab Results  Component Value Date   WBC 8.9 07/24/2019   HGB 13.8 07/24/2019   HCT 41.5 07/24/2019   MCV 87.0 07/24/2019   PLT 416 (H) 07/24/2019    NEUTROABS 2.8 07/16/2019    ASSESSMENT & PLAN:  Ductal carcinoma in situ (DCIS) of left breast 07/11/2019:Screening mammogram showed indeterminate left breast calcifications, 2.0cm, 12:30 position. Biopsy showed DCIS with suspicion of microinvasion, grade 2, ER+ 95%, PR+ 20%.    Recommendation: 1. Breast conserving surgery 08/01/19: Left lumpectomy: HG DCIS with necrosis margins neg, 0/2 LN neg ER 95%, PR 20% 2. Followed by adjuvant radiation therapy 09/09/2019-10/24/2019 3. Followed by antiestrogen therapy with tamoxifen 5 years started 10/27/2019   Tamoxifen Toxicities: Severe hot flashes: In spite of gabapentin the hot flashes have continued to be very severe.  Therefore I recommended that she cut the dose of tamoxifen in half and she will start taking half a tablet daily.  I instructed her to call us if this does not help.  We can consider Effexor as an alternative.  I instructed her to discontinue gabapentin.  Breast Cancer Surveillance: Mammograms at [facility] June 2022: Benign  RTC in 1 year for follow-up    No orders of the defined types were placed in this encounter.  The patient has a good understanding of the overall plan. she agrees with it. she will call with any problems that may develop before the next visit here.  Total time spent: 20 mins including face to face time and time spent for planning, charting and coordination of care  Rulon Eisenmenger, MD, MPH 09/01/2020  I, Thana Ates, am acting as scribe for Dr. Nicholas Lose.  I have reviewed the above documentation for accuracy and completeness, and I agree with the above.

## 2020-08-31 NOTE — Assessment & Plan Note (Signed)
/  18/2021:Screening mammogram showed indeterminate left breast calcifications, 2.0cm, 12:30 position. Biopsy showed DCISwith suspicion of microinvasion, grade 2, ER+ 95%, PR+ 20%.  Recommendation: 1. Breast conserving surgery7/9/21:Left lumpectomy: HG DCIS with necrosis margins neg, 0/2 LN neg ER 95%, PR 20% 2. Followed by adjuvant radiation therapy 09/09/2019-10/24/2019 3. Followed by antiestrogen therapy with tamoxifen 5 years  Tamoxifen Toxicities:  Breast Cancer Surveillance: 1.Breast Exam: 09/01/20 2. Mammograms  RTC in 3 months for SCP visit

## 2020-09-01 ENCOUNTER — Inpatient Hospital Stay
Payer: Federal, State, Local not specified - PPO | Attending: Hematology and Oncology | Admitting: Hematology and Oncology

## 2020-09-01 ENCOUNTER — Other Ambulatory Visit: Payer: Self-pay

## 2020-09-01 DIAGNOSIS — R232 Flushing: Secondary | ICD-10-CM | POA: Diagnosis not present

## 2020-09-01 DIAGNOSIS — Z170 Estrogen receptor positive status [ER+]: Secondary | ICD-10-CM | POA: Diagnosis not present

## 2020-09-01 DIAGNOSIS — D0512 Intraductal carcinoma in situ of left breast: Secondary | ICD-10-CM | POA: Diagnosis not present

## 2020-09-01 DIAGNOSIS — Z923 Personal history of irradiation: Secondary | ICD-10-CM | POA: Diagnosis not present

## 2020-09-01 DIAGNOSIS — Z79899 Other long term (current) drug therapy: Secondary | ICD-10-CM | POA: Diagnosis not present

## 2020-09-01 DIAGNOSIS — Z793 Long term (current) use of hormonal contraceptives: Secondary | ICD-10-CM | POA: Diagnosis not present

## 2020-09-01 DIAGNOSIS — Z79810 Long term (current) use of selective estrogen receptor modulators (SERMs): Secondary | ICD-10-CM | POA: Insufficient documentation

## 2020-09-01 MED ORDER — TAMOXIFEN CITRATE 20 MG PO TABS
10.0000 mg | ORAL_TABLET | Freq: Every day | ORAL | 3 refills | Status: DC
Start: 2020-09-01 — End: 2020-09-20

## 2020-09-20 ENCOUNTER — Other Ambulatory Visit: Payer: Self-pay | Admitting: Hematology and Oncology

## 2020-12-17 ENCOUNTER — Other Ambulatory Visit: Payer: Self-pay | Admitting: Hematology and Oncology

## 2021-01-14 IMAGING — CT CT HEAD WITHOUT CONTRAST
4 series · 16 of 47 positions shown, 18 images · non-contrast
Comparison: None.

CLINICAL DATA: Dizziness and left ear pain with headache

EXAM:
CT HEAD WITHOUT CONTRAST
TECHNIQUE: Contiguous axial images were obtained from the base of the skull
through the vertex without intravenous contrast.

[Series 3: head without · axial · non-contrast · 0.44mm/px · z∈[-105,+15]mm · 7 of 34 slices shown, 9 images]
[im 5/34  brain]
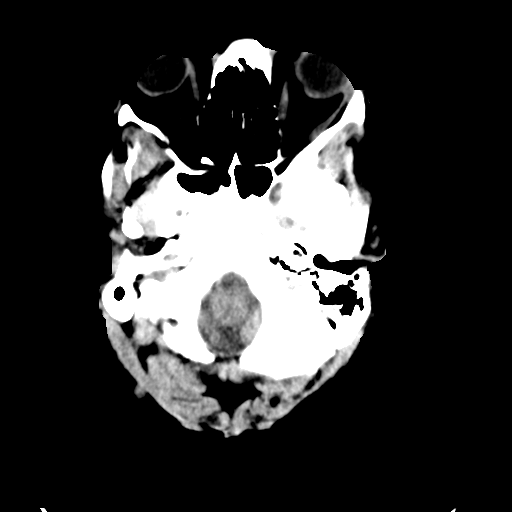
[im 5/34  bone]
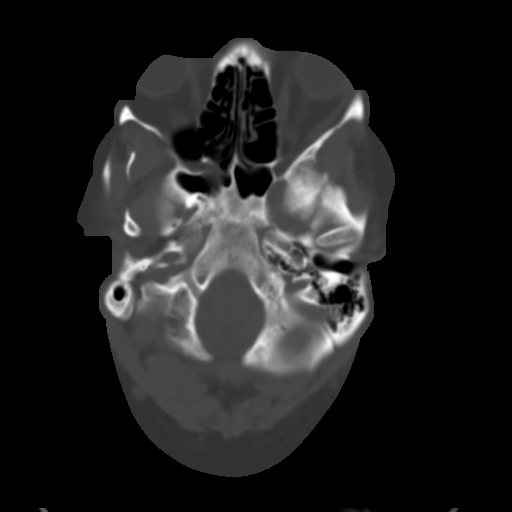
[im 9/34  brain]
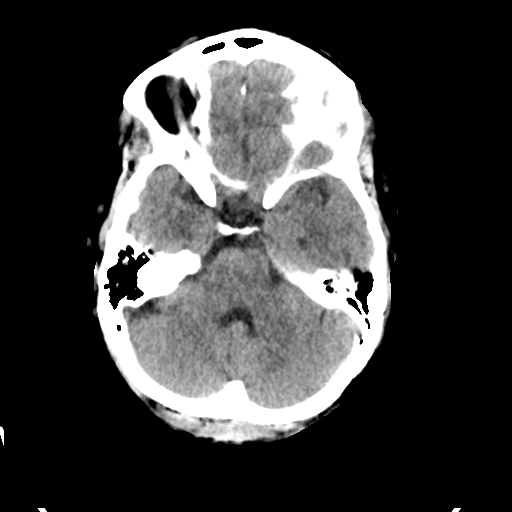
[im 13/34  brain]
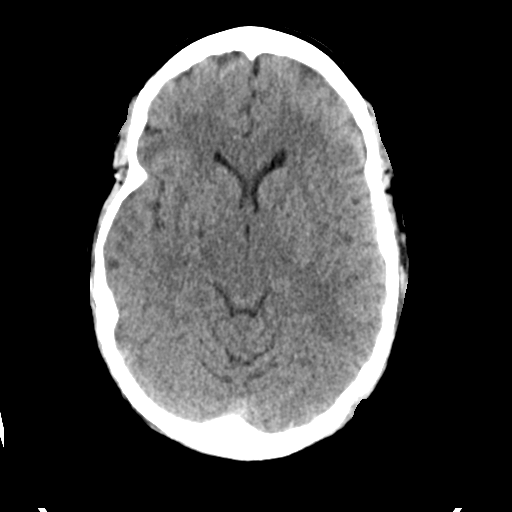
[im 17/34  brain]
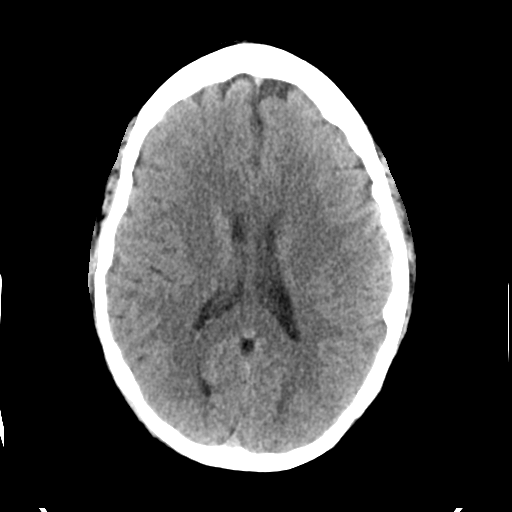
[im 21/34  brain]
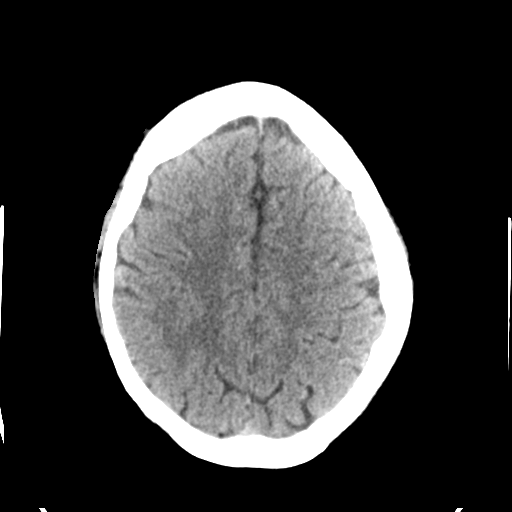
[im 21/34  bone]
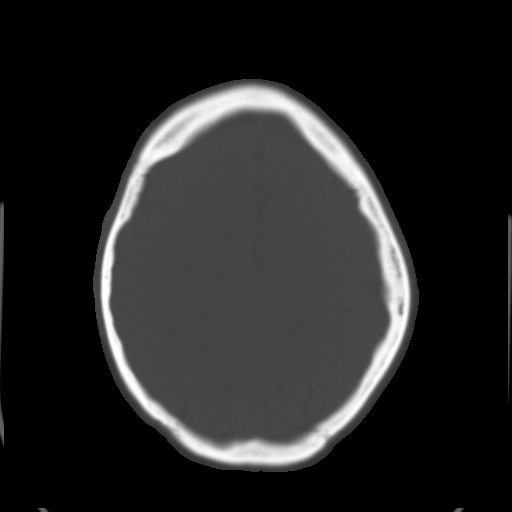
[im 25/34  brain]
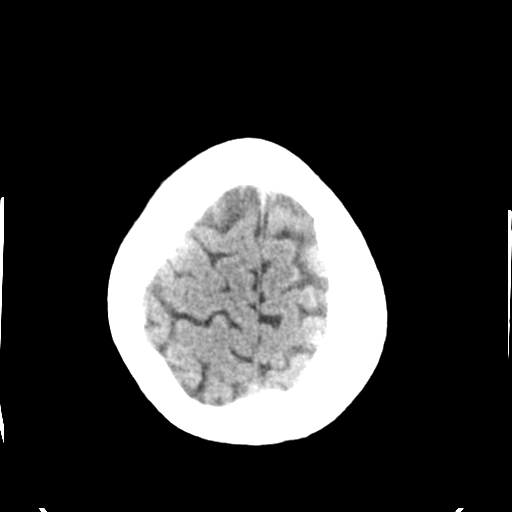
[im 29/34  brain]
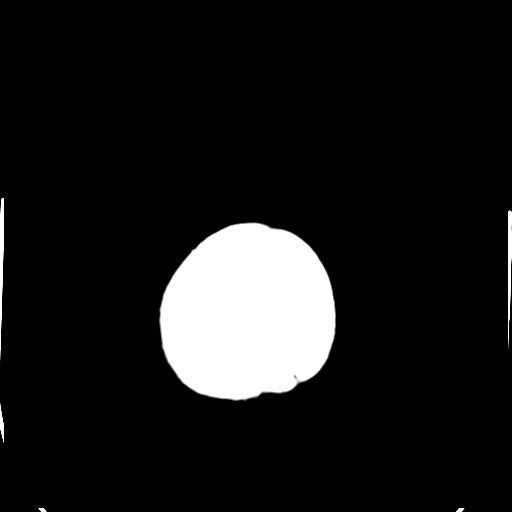

[Series 4: head bone · axial · 0.44mm/px · z∈[-109,-77]mm · 3 of 84 slices shown]
[im 9/84  bone]
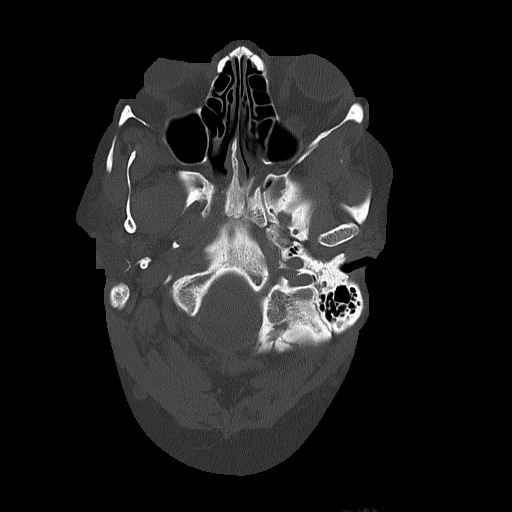
[im 17/84  bone]
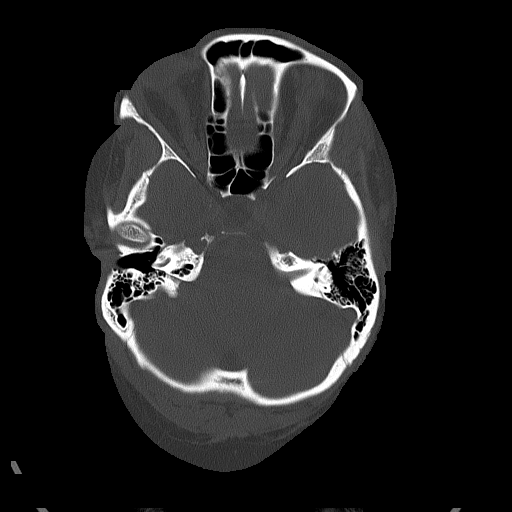
[im 25/84  bone]
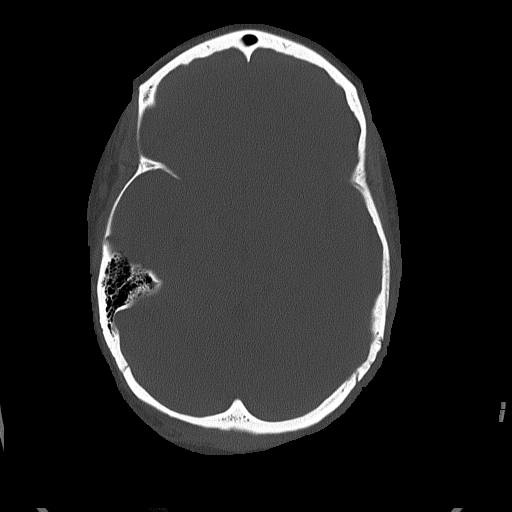

[Series 5: head without cor · coronal · non-contrast · 0.33mm/px · 3 of 76 slices shown]
[im 26/76  brain]
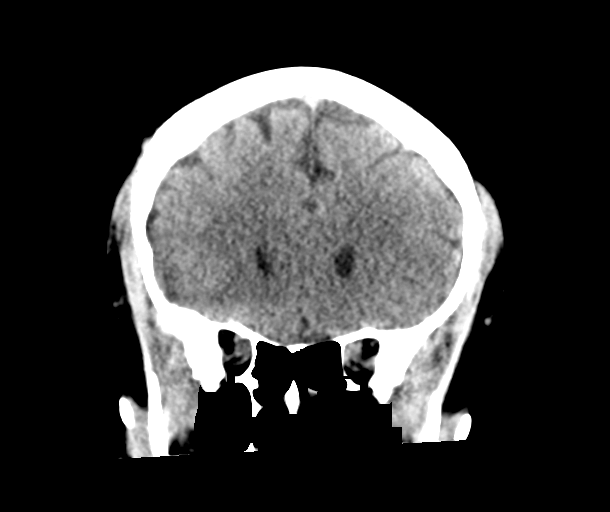
[im 34/76  brain]
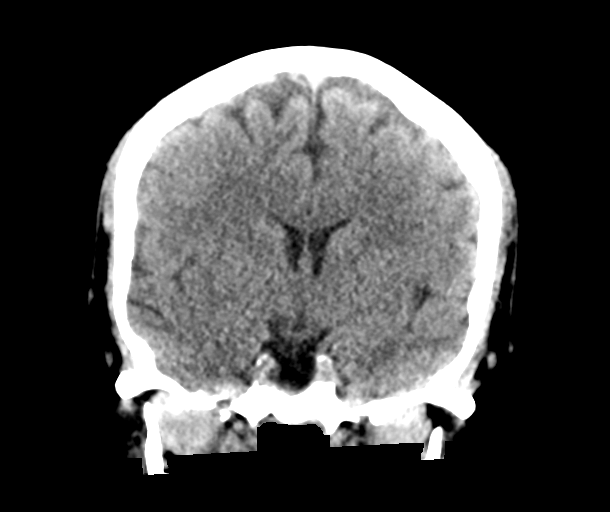
[im 42/76  brain]
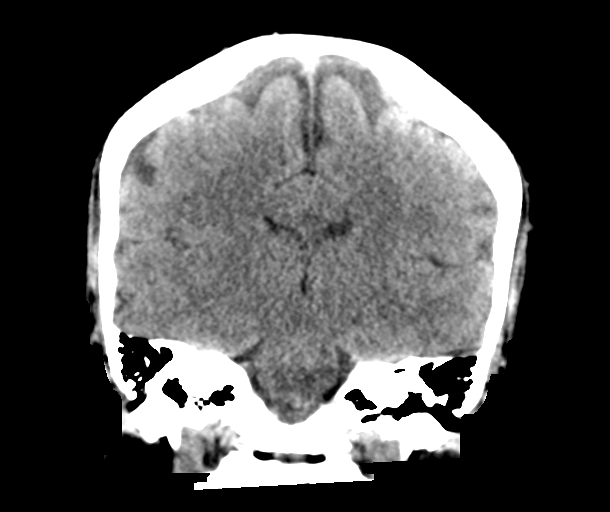

[Series 6: head without sag · sagittal · non-contrast · 0.34mm/px · 3 of 67 slices shown]
[im 23/67  brain]
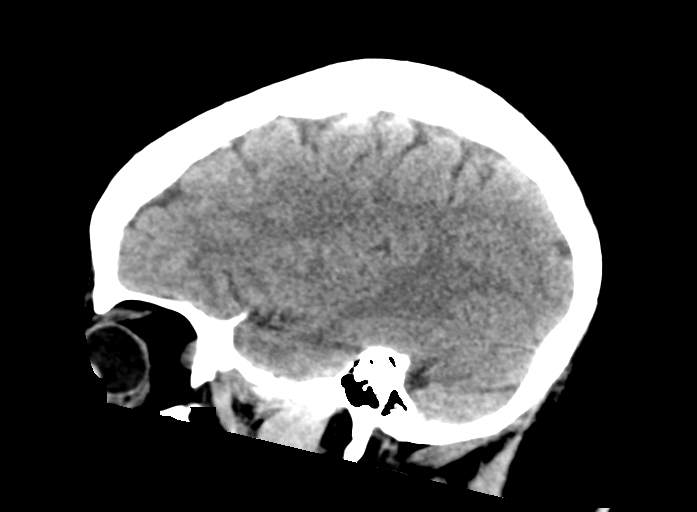
[im 34/67  brain]
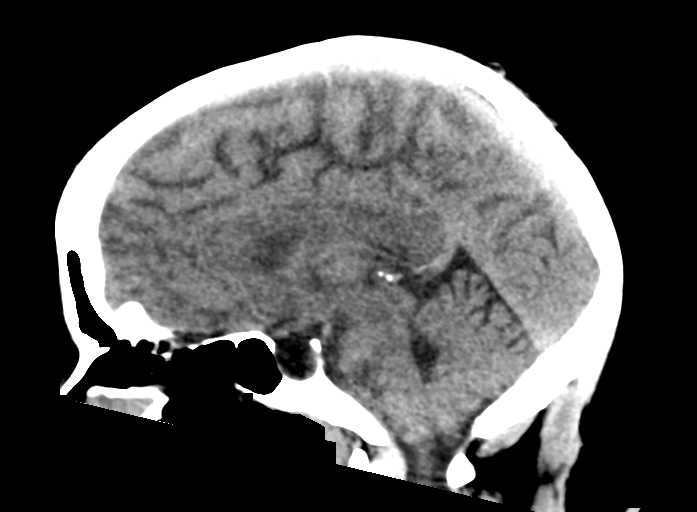
[im 45/67  brain]
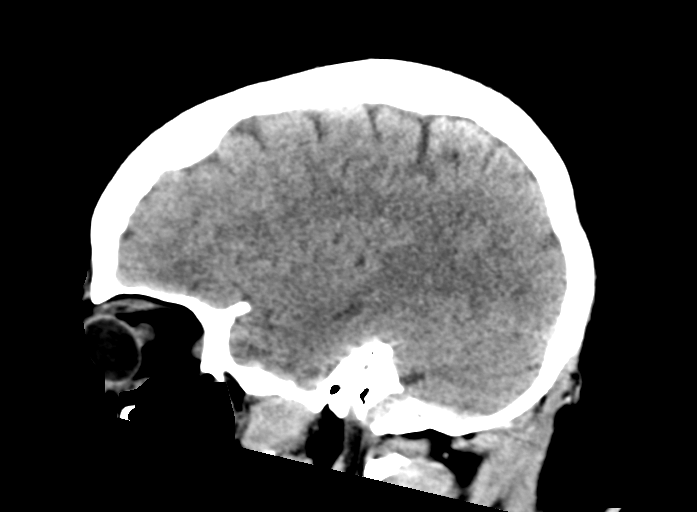

[16 of 47 positions shown; findings below may reference images not displayed]

FINDINGS: Brain: There is no mass, hemorrhage or extra-axial collection. The
size and configuration of the ventricles and extra-axial CSF spaces
are normal. The brain parenchyma is normal, without acute or chronic
infarction.

Vascular: No abnormal hyperdensity of the major intracranial
arteries or dural venous sinuses. No intracranial atherosclerosis.

Skull: The visualized skull base, calvarium and extracranial soft
tissues are normal.

Sinuses/Orbits: No fluid levels or advanced mucosal thickening of
the visualized paranasal sinuses. No mastoid or middle ear effusion.
The orbits are normal.
IMPRESSION: Normal head CT.

## 2021-03-14 ENCOUNTER — Other Ambulatory Visit: Payer: Self-pay | Admitting: Hematology and Oncology

## 2021-05-10 DIAGNOSIS — Z170 Estrogen receptor positive status [ER+]: Secondary | ICD-10-CM | POA: Diagnosis not present

## 2021-05-10 DIAGNOSIS — C50412 Malignant neoplasm of upper-outer quadrant of left female breast: Secondary | ICD-10-CM | POA: Diagnosis not present

## 2021-05-10 DIAGNOSIS — I890 Lymphedema, not elsewhere classified: Secondary | ICD-10-CM | POA: Diagnosis not present

## 2021-05-10 DIAGNOSIS — R232 Flushing: Secondary | ICD-10-CM | POA: Diagnosis not present

## 2021-06-27 DIAGNOSIS — R928 Other abnormal and inconclusive findings on diagnostic imaging of breast: Secondary | ICD-10-CM | POA: Diagnosis not present

## 2021-06-27 DIAGNOSIS — Z853 Personal history of malignant neoplasm of breast: Secondary | ICD-10-CM | POA: Diagnosis not present

## 2021-07-14 ENCOUNTER — Other Ambulatory Visit: Payer: Self-pay | Admitting: Family Medicine

## 2021-07-14 ENCOUNTER — Other Ambulatory Visit (HOSPITAL_COMMUNITY)
Admission: RE | Admit: 2021-07-14 | Discharge: 2021-07-14 | Disposition: A | Discharge status: Home / Self Care | Payer: Federal, State, Local not specified - PPO | Source: Ambulatory Visit | Attending: Family Medicine | Admitting: Family Medicine

## 2021-07-14 DIAGNOSIS — E119 Type 2 diabetes mellitus without complications: Secondary | ICD-10-CM | POA: Diagnosis not present

## 2021-07-14 DIAGNOSIS — N760 Acute vaginitis: Secondary | ICD-10-CM | POA: Diagnosis not present

## 2021-07-14 DIAGNOSIS — Z01419 Encounter for gynecological examination (general) (routine) without abnormal findings: Secondary | ICD-10-CM | POA: Insufficient documentation

## 2021-07-14 DIAGNOSIS — E785 Hyperlipidemia, unspecified: Secondary | ICD-10-CM | POA: Diagnosis not present

## 2021-07-14 DIAGNOSIS — Z0000 Encounter for general adult medical examination without abnormal findings: Secondary | ICD-10-CM | POA: Diagnosis not present

## 2021-07-14 DIAGNOSIS — I10 Essential (primary) hypertension: Secondary | ICD-10-CM | POA: Diagnosis not present

## 2021-07-15 LAB — CYTOLOGY - PAP
Adequacy: ABSENT
Comment: NEGATIVE
Diagnosis: NEGATIVE
High risk HPV: NEGATIVE

## 2021-07-22 ENCOUNTER — Encounter (HOSPITAL_COMMUNITY): Payer: Self-pay | Admitting: *Deleted

## 2021-09-01 ENCOUNTER — Inpatient Hospital Stay: Payer: Federal, State, Local not specified - PPO | Admitting: Hematology and Oncology

## 2021-09-01 ENCOUNTER — Other Ambulatory Visit: Payer: Self-pay | Admitting: *Deleted

## 2021-09-01 NOTE — Patient Outreach (Signed)
  Care Coordination   09/01/2021 Name: [name] MRN: [id number] DOB: 1965-12-15   Care Coordination Outreach Attempts:  An unsuccessful telephone outreach was attempted today to offer the patient information about available care coordination services as a benefit of their health plan.   Follow Up Plan:  Additional outreach attempts will be made to offer the patient care coordination information and services.   Encounter Outcome:  No Answer  Care Coordination Interventions Activated:  No   Care Coordination Interventions:  No, not indicated    Raina Mina, RN Care Management Coordinator [place] Office [phone]

## 2021-09-08 NOTE — Progress Notes (Signed)
Patient Care Team: Maurice Small, MD as PCP - General (Family Medicine) Stark Klein, MD as Consulting Physician (General Surgery) Nicholas Lose, MD as Consulting Physician (Hematology and Oncology) Kyung Rudd, MD as Consulting Physician (Radiation Oncology)  DIAGNOSIS:  Encounter Diagnosis  Name Primary?   Ductal carcinoma in situ (DCIS) of left breast     SUMMARY OF ONCOLOGIC HISTORY: Oncology History  Ductal carcinoma in situ (DCIS) of left breast  07/11/2019 Initial Diagnosis   Screening mammogram showed indeterminate left breast calcifications, 2.0cm, 12:30 position. Biopsy showed DCIS, grade 2, ER+ 95%, PR+ 20%.    08/01/2019 Surgery   Left lumpectomy Barry Dienes) [phone]): high grade DCIS with necrosis, ER 95%, PR 20%. 2 regional lymph nodes were negative for carcinoma. Clear margins.   09/08/2019 - 10/27/2019 Radiation Therapy   The patient initially received a dose of 50.4 Gy in 28 fractions to the breast using whole-breast tangent fields. This was delivered using a 3-D conformal technique. The patient then received a boost to the seroma. This delivered an additional 10 Gy in 5 fractions using a 3 field photon boost technique. The total dose was 60.4 Gy.   10/27/2019 - 10/2024 Anti-estrogen oral therapy   Tamoxifen   01/31/2020 Cancer Staging   Staging form: Breast, AJCC 8th Edition - Pathologic: Stage 0 (pTis (DCIS), pN0, cM0, ER+, PR+)     CHIEF COMPLIANT:  Follow-up of left breast DCIS on Tamoxifen.  INTERVAL HISTORY: [name] is a 56 y.o. with above-mentioned history of  left breast DCIS who underwent a left lumpectomy and radiation, currently on antiestrogen therapy with tamoxifen. She presents to the clinic today for follow-up. She states that she is still having hot flashes. She complains of pain and swelling in the breast on left side and she also felt a lump that she was concern about. She also has discomfort on left side of stomach   ALLERGIES:  has No  Known Allergies.  MEDICATIONS:  Current Outpatient Medications  Medication Sig Dispense Refill   ascorbic acid (VITAMIN C) 500 MG tablet Take 500 mg by mouth daily.     CALCIUM PO Take 1,200 mg by mouth daily.      FLUoxetine (PROZAC) 40 MG capsule Take 40 mg by mouth daily.     hydrochlorothiazide (HYDRODIURIL) 12.5 MG tablet Take 12.5 mg by mouth daily.     levonorgestrel (MIRENA) 20 MCG/24HR IUD 1 each by Intrauterine route once. Inserted 2017     Multiple Vitamin (MULTIVITAMIN WITH MINERALS) TABS tablet Take 1 tablet by mouth daily.     tamoxifen (NOLVADEX) 20 MG tablet Take 0.5 tablets (10 mg total) by mouth daily. 90 tablet 0   No current facility-administered medications for this visit.    PHYSICAL EXAMINATION: ECOG PERFORMANCE STATUS: 1 - Symptomatic but completely ambulatory  Vitals:   09/12/21 1334  BP: 124/66  Pulse: 71  Resp: 18  Temp: 98.4 F (36.9 C)  SpO2: 99%   Filed Weights   09/12/21 1334  Weight: 170 lb 14.4 oz (77.5 kg)    BREAST: No palpable masses or nodules in either right or left breasts. No palpable axillary supraclavicular or infraclavicular adenopathy no breast tenderness or nipple discharge. (exam performed in the presence of a chaperone)  LABORATORY DATA:  I have reviewed the data as listed    Latest Ref Rng & Units 07/24/2019    1:42 PM 07/16/2019    8:31 AM 12/25/2018    3:07 AM  CMP  Glucose 70 -  99 mg/dL 90  98  129   BUN 6 - 20 mg/dL '19  17  14   '$ Creatinine 0.44 - 1.00 mg/dL 0.81  0.86  0.65   Sodium 135 - 145 mmol/L 137  137  135   Potassium 3.5 - 5.1 mmol/L 4.4  4.6  3.9   Chloride 98 - 111 mmol/L 101  106  102   CO2 22 - 32 mmol/L '27  24  23   '$ Calcium 8.9 - 10.3 mg/dL 9.4  9.4  8.6   Total Protein 6.5 - 8.1 g/dL  7.4  7.1   Total Bilirubin 0.3 - 1.2 mg/dL  0.4  0.6   Alkaline Phos 38 - 126 U/L  70  65   AST 15 - 41 U/L  24  29   ALT 0 - 44 U/L  28  34     Lab Results  Component Value Date   WBC 8.9 07/24/2019   HGB 13.8  07/24/2019   HCT 41.5 07/24/2019   MCV 87.0 07/24/2019   PLT 416 (H) 07/24/2019   NEUTROABS 2.8 07/16/2019    ASSESSMENT & PLAN:  Ductal carcinoma in situ (DCIS) of left breast 07/11/2019:Screening mammogram showed indeterminate left breast calcifications, 2.0cm, 12:30 position. Biopsy showed DCIS with suspicion of microinvasion, grade 2, ER+ 95%, PR+ 20%.   Recommendation:  1. Breast conserving surgery 08/01/19: Left lumpectomy: HG DCIS with necrosis margins neg, 0/2 LN neg ER 95%, PR 20%  2. Followed by adjuvant radiation therapy 09/09/2019-10/24/2019  3. Followed by antiestrogen therapy with tamoxifen 5 years started 10/27/2019   Tamoxifen Toxicities:   hot flashes: She stopped gabapentin and is taking another medicine for her.  She cannot remember the name of the medicine.  Breast Cancer Surveillance: 1. Mammograms at [facility] 06/27/2021: Benign, breast density category B 2. breast exam 09/12/2021: Benign Lipoma is felt below the left breast.   RTC in 1 year for follow-up    No orders of the defined types were placed in this encounter.  The patient has a good understanding of the overall plan. she agrees with it. she will call with any problems that may develop before the next visit here. Total time spent: 30 mins including face to face time and time spent for planning, charting and co-ordination of care   Harriette Ohara, MD 09/12/21    I Gardiner Coins am scribing for Dr. Lindi Adie  I have reviewed the above documentation for accuracy and completeness, and I agree with the above.

## 2021-09-12 ENCOUNTER — Other Ambulatory Visit: Payer: Self-pay

## 2021-09-12 ENCOUNTER — Inpatient Hospital Stay
Payer: Federal, State, Local not specified - PPO | Attending: Hematology and Oncology | Admitting: Hematology and Oncology

## 2021-09-12 DIAGNOSIS — R232 Flushing: Secondary | ICD-10-CM | POA: Diagnosis not present

## 2021-09-12 DIAGNOSIS — Z923 Personal history of irradiation: Secondary | ICD-10-CM | POA: Insufficient documentation

## 2021-09-12 DIAGNOSIS — Z79810 Long term (current) use of selective estrogen receptor modulators (SERMs): Secondary | ICD-10-CM | POA: Diagnosis not present

## 2021-09-12 DIAGNOSIS — D179 Benign lipomatous neoplasm, unspecified: Secondary | ICD-10-CM | POA: Diagnosis not present

## 2021-09-12 DIAGNOSIS — Z853 Personal history of malignant neoplasm of breast: Secondary | ICD-10-CM | POA: Insufficient documentation

## 2021-09-12 DIAGNOSIS — D0512 Intraductal carcinoma in situ of left breast: Secondary | ICD-10-CM | POA: Diagnosis not present

## 2021-09-12 MED ORDER — TAMOXIFEN CITRATE 20 MG PO TABS: 10.0000 mg | ORAL_TABLET | Freq: Every day | ORAL | 0 refills | Status: DC

## 2021-09-12 NOTE — Assessment & Plan Note (Addendum)
07/11/2019:Screening mammogram showed indeterminate left breast calcifications, 2.0cm, 12:30 position. Biopsy showed DCIS with suspicion of microinvasion, grade 2, ER+ 95%, PR+ 20%.   Recommendation:  1. Breast conserving surgery 08/01/19: Left lumpectomy: HG DCIS with necrosis margins neg, 0/2 LN neg ER 95%, PR 20%  2. Followed by adjuvant radiation therapy 09/09/2019-10/24/2019  3. Followed by antiestrogen therapy with tamoxifen 5 years started 10/27/2019   Tamoxifen Toxicities:   hot flashes: She stopped gabapentin and is taking another medicine for her.  She cannot remember the name of the medicine.  Breast Cancer Surveillance: 1. Mammograms at [facility] 06/27/2021: Benign, breast density category B 2. breast exam 09/12/2021: Benign Lipoma is felt below the left breast.  RTC in 1 year for follow-up

## 2021-10-07 ENCOUNTER — Ambulatory Visit: Payer: Self-pay

## 2021-10-07 NOTE — Patient Outreach (Signed)
  Care Coordination   10/07/2021 Name: LADAJAH SOLTYS MRN: [id number] DOB: June 29, 1965   Care Coordination Outreach Attempts:  A second unsuccessful outreach was attempted today to offer the patient with information about available care coordination services as a benefit of their health plan.     Follow Up Plan:  Additional outreach attempts will be made to offer the patient care coordination information and services.   Encounter Outcome:  No Answer  Care Coordination Interventions Activated:  No   Care Coordination Interventions:  No, not indicated    Daneen Schick, BSW, CDP Social Worker, Certified Dementia Practitioner [facility] Care Management  Care Coordination [phone]

## 2021-10-18 ENCOUNTER — Telehealth: Payer: Self-pay | Admitting: *Deleted

## 2021-10-18 NOTE — Patient Outreach (Signed)
  Care Coordination   10/18/2021 Name: [name] MRN: [id number] DOB: 1965-03-25   Care Coordination Outreach Attempts:  A third unsuccessful outreach was attempted today to offer the patient with information about available care coordination services as a benefit of their health plan.   Follow Up Plan:  No further outreach attempts will be made at this time. We have been unable to contact the patient to offer or enroll patient in care coordination services  Encounter Outcome:  No Answer  Care Coordination Interventions Activated:  No   Care Coordination Interventions:  No, not indicated    Raina Mina, RN Care Management Coordinator [place] Office [phone]

## 2022-01-02 DIAGNOSIS — K5730 Diverticulosis of large intestine without perforation or abscess without bleeding: Secondary | ICD-10-CM | POA: Diagnosis not present

## 2022-01-02 DIAGNOSIS — K648 Other hemorrhoids: Secondary | ICD-10-CM | POA: Diagnosis not present

## 2022-01-02 DIAGNOSIS — Z1211 Encounter for screening for malignant neoplasm of colon: Secondary | ICD-10-CM | POA: Diagnosis not present

## 2022-02-03 DIAGNOSIS — R61 Generalized hyperhidrosis: Secondary | ICD-10-CM | POA: Diagnosis not present

## 2022-02-03 DIAGNOSIS — N943 Premenstrual tension syndrome: Secondary | ICD-10-CM | POA: Diagnosis not present

## 2022-03-13 ENCOUNTER — Other Ambulatory Visit: Payer: Self-pay | Admitting: Hematology and Oncology

## 2022-03-22 DIAGNOSIS — Z30432 Encounter for removal of intrauterine contraceptive device: Secondary | ICD-10-CM | POA: Diagnosis not present

## 2022-05-02 DIAGNOSIS — E119 Type 2 diabetes mellitus without complications: Secondary | ICD-10-CM | POA: Diagnosis not present

## 2022-05-02 DIAGNOSIS — E6609 Other obesity due to excess calories: Secondary | ICD-10-CM | POA: Diagnosis not present

## 2022-05-02 DIAGNOSIS — Z713 Dietary counseling and surveillance: Secondary | ICD-10-CM | POA: Diagnosis not present

## 2022-05-02 DIAGNOSIS — Z6831 Body mass index (BMI) 31.0-31.9, adult: Secondary | ICD-10-CM | POA: Diagnosis not present

## 2022-05-03 ENCOUNTER — Other Ambulatory Visit (HOSPITAL_COMMUNITY): Payer: Self-pay

## 2022-05-03 MED ORDER — WEGOVY 1.7 MG/0.75ML ~~LOC~~ SOAJ
1.7000 mg | SUBCUTANEOUS | 0 refills | Status: AC
  Filled 2022-07-19: qty 3, 28d supply, fill #0

## 2022-05-03 MED ORDER — WEGOVY 1 MG/0.5ML ~~LOC~~ SOAJ
1.0000 mg | SUBCUTANEOUS | 0 refills | Status: AC
  Filled 2022-06-21: qty 2, 28d supply, fill #0

## 2022-05-03 MED ORDER — WEGOVY 0.5 MG/0.5ML ~~LOC~~ SOAJ
0.5000 mg | SUBCUTANEOUS | 0 refills | Status: AC
  Filled 2022-05-30: qty 2, 28d supply, fill #0

## 2022-05-03 MED ORDER — WEGOVY 0.25 MG/0.5ML ~~LOC~~ SOAJ
0.2500 mg | SUBCUTANEOUS | 0 refills | Status: AC
  Filled 2022-05-03: qty 2, 28d supply, fill #0

## 2022-05-03 MED ORDER — WEGOVY 2.4 MG/0.75ML ~~LOC~~ SOAJ
2.4000 mg | SUBCUTANEOUS | 0 refills | Status: DC
  Filled 2022-08-16: qty 3, 28d supply, fill #0

## 2022-05-30 ENCOUNTER — Other Ambulatory Visit (HOSPITAL_COMMUNITY): Payer: Self-pay

## 2022-05-30 DIAGNOSIS — E119 Type 2 diabetes mellitus without complications: Secondary | ICD-10-CM | POA: Diagnosis not present

## 2022-06-12 ENCOUNTER — Other Ambulatory Visit: Payer: Self-pay | Admitting: Hematology and Oncology

## 2022-06-21 ENCOUNTER — Other Ambulatory Visit (HOSPITAL_COMMUNITY): Payer: Self-pay

## 2022-06-29 DIAGNOSIS — Z1231 Encounter for screening mammogram for malignant neoplasm of breast: Secondary | ICD-10-CM | POA: Diagnosis not present

## 2022-07-19 ENCOUNTER — Other Ambulatory Visit (HOSPITAL_COMMUNITY): Payer: Self-pay

## 2022-07-24 ENCOUNTER — Encounter (HOSPITAL_COMMUNITY): Payer: Self-pay | Admitting: *Deleted

## 2022-07-25 ENCOUNTER — Other Ambulatory Visit (HOSPITAL_COMMUNITY): Payer: Self-pay

## 2022-07-25 DIAGNOSIS — E559 Vitamin D deficiency, unspecified: Secondary | ICD-10-CM | POA: Diagnosis not present

## 2022-07-25 DIAGNOSIS — Z0000 Encounter for general adult medical examination without abnormal findings: Secondary | ICD-10-CM | POA: Diagnosis not present

## 2022-07-25 DIAGNOSIS — E782 Mixed hyperlipidemia: Secondary | ICD-10-CM | POA: Diagnosis not present

## 2022-07-25 DIAGNOSIS — E119 Type 2 diabetes mellitus without complications: Secondary | ICD-10-CM | POA: Diagnosis not present

## 2022-07-25 DIAGNOSIS — I10 Essential (primary) hypertension: Secondary | ICD-10-CM | POA: Diagnosis not present

## 2022-07-25 DIAGNOSIS — D0512 Intraductal carcinoma in situ of left breast: Secondary | ICD-10-CM | POA: Diagnosis not present

## 2022-07-25 MED ORDER — WEGOVY 2.4 MG/0.75ML ~~LOC~~ SOAJ
2.4000 mg | SUBCUTANEOUS | 1 refills | Status: AC
  Filled 2022-07-25 – 2022-11-03 (×3): qty 3, 28d supply, fill #0
  Filled 2022-12-01: qty 3, 28d supply, fill #1
  Filled 2023-01-21: qty 3, 28d supply, fill #2
  Filled 2023-02-18: qty 3, 28d supply, fill #3

## 2022-08-08 DIAGNOSIS — R5383 Other fatigue: Secondary | ICD-10-CM | POA: Diagnosis not present

## 2022-08-08 DIAGNOSIS — E569 Vitamin deficiency, unspecified: Secondary | ICD-10-CM | POA: Diagnosis not present

## 2022-08-08 DIAGNOSIS — C50412 Malignant neoplasm of upper-outer quadrant of left female breast: Secondary | ICD-10-CM | POA: Diagnosis not present

## 2022-08-08 DIAGNOSIS — Z903 Acquired absence of stomach [part of]: Secondary | ICD-10-CM | POA: Diagnosis not present

## 2022-08-16 ENCOUNTER — Other Ambulatory Visit (HOSPITAL_COMMUNITY): Payer: Self-pay

## 2022-08-30 ENCOUNTER — Telehealth: Payer: Self-pay | Admitting: Hematology and Oncology

## 2022-08-30 NOTE — Telephone Encounter (Signed)
Called twice and also left a message regarding rescheduled appointment times/dates

## 2022-09-07 ENCOUNTER — Other Ambulatory Visit: Payer: Self-pay | Admitting: Hematology and Oncology

## 2022-09-13 ENCOUNTER — Inpatient Hospital Stay: Payer: Federal, State, Local not specified - PPO | Admitting: Hematology and Oncology

## 2022-09-13 ENCOUNTER — Other Ambulatory Visit (HOSPITAL_COMMUNITY): Payer: Self-pay

## 2022-09-13 MED ORDER — WEGOVY 2.4 MG/0.75ML ~~LOC~~ SOAJ
2.4000 mg | SUBCUTANEOUS | 1 refills | Status: AC
  Filled 2022-09-13: qty 3, 28d supply, fill #0
  Filled 2022-10-06: qty 3, 28d supply, fill #1
  Filled 2022-12-27: qty 3, 28d supply, fill #2

## 2022-10-03 ENCOUNTER — Inpatient Hospital Stay
Payer: Federal, State, Local not specified - PPO | Attending: Hematology and Oncology | Admitting: Hematology and Oncology

## 2022-10-03 NOTE — Assessment & Plan Note (Deleted)
07/11/2019:Screening mammogram showed indeterminate left breast calcifications, 2.0cm, 12:30 position. Biopsy showed DCIS with suspicion of microinvasion, grade 2, ER+ 95%, PR+ 20%.    Recommendation:  1. Breast conserving surgery 08/01/19: Left lumpectomy: HG DCIS with necrosis margins neg, 0/2 LN neg ER 95%, PR 20%  2. Followed by adjuvant radiation therapy 09/09/2019-10/24/2019  3. Followed by antiestrogen therapy with tamoxifen 5 years started 10/27/2019    Tamoxifen Toxicities:   hot flashes: She stopped gabapentin and is taking another medicine for her.  She cannot remember the name of the medicine.   Breast Cancer Surveillance: 1. Mammograms at [facility] 06/27/2021: Benign, breast density category B 2. breast exam 10/03/2022: Benign Lipoma is felt below the left breast.   RTC in 1 year for follow-up

## 2022-10-06 ENCOUNTER — Other Ambulatory Visit (HOSPITAL_COMMUNITY): Payer: Self-pay

## 2022-10-06 ENCOUNTER — Other Ambulatory Visit: Payer: Self-pay

## 2022-10-09 ENCOUNTER — Other Ambulatory Visit (HOSPITAL_COMMUNITY): Payer: Self-pay

## 2022-10-09 ENCOUNTER — Other Ambulatory Visit: Payer: Self-pay

## 2022-10-24 DIAGNOSIS — Z23 Encounter for immunization: Secondary | ICD-10-CM | POA: Diagnosis not present

## 2022-10-24 DIAGNOSIS — I10 Essential (primary) hypertension: Secondary | ICD-10-CM | POA: Diagnosis not present

## 2022-10-24 DIAGNOSIS — E119 Type 2 diabetes mellitus without complications: Secondary | ICD-10-CM | POA: Diagnosis not present

## 2022-10-25 ENCOUNTER — Telehealth: Payer: Self-pay | Admitting: Hematology and Oncology

## 2022-10-25 NOTE — Telephone Encounter (Signed)
Per Staff message on 10/25/22; I called patient and rescheduled appointment. Patient is aware of the date and time of appointment.

## 2022-11-01 ENCOUNTER — Inpatient Hospital Stay
Payer: Federal, State, Local not specified - PPO | Attending: Hematology and Oncology | Admitting: Hematology and Oncology

## 2022-11-01 VITALS — BP 126/70 | HR 72 | Temp 97.5°F | Resp 18 | Ht 65.0 in | Wt 162.0 lb

## 2022-11-01 DIAGNOSIS — Z923 Personal history of irradiation: Secondary | ICD-10-CM | POA: Diagnosis not present

## 2022-11-01 DIAGNOSIS — D0512 Intraductal carcinoma in situ of left breast: Secondary | ICD-10-CM | POA: Diagnosis not present

## 2022-11-01 DIAGNOSIS — Z79810 Long term (current) use of selective estrogen receptor modulators (SERMs): Secondary | ICD-10-CM | POA: Diagnosis not present

## 2022-11-01 DIAGNOSIS — Z170 Estrogen receptor positive status [ER+]: Secondary | ICD-10-CM | POA: Insufficient documentation

## 2022-11-01 DIAGNOSIS — Z1721 Progesterone receptor positive status: Secondary | ICD-10-CM | POA: Diagnosis not present

## 2022-11-01 DIAGNOSIS — R232 Flushing: Secondary | ICD-10-CM | POA: Diagnosis not present

## 2022-11-01 MED ORDER — TAMOXIFEN CITRATE 20 MG PO TABS: 20.0000 mg | ORAL_TABLET | Freq: Every day | ORAL | 0 refills | Status: DC

## 2022-11-01 NOTE — Assessment & Plan Note (Addendum)
07/11/2019:Screening mammogram showed indeterminate left breast calcifications, 2.0cm, 12:30 position. Biopsy showed DCIS with suspicion of microinvasion, grade 2, ER+ 95%, PR+ 20%.    Recommendation:  1. Breast conserving surgery 08/01/19: Left lumpectomy: HG DCIS with necrosis margins neg, 0/2 LN neg ER 95%, PR 20%  2. Followed by adjuvant radiation therapy 09/09/2019-10/24/2019  3. Followed by antiestrogen therapy with tamoxifen 5 years started 10/27/2019    Tamoxifen Toxicities:   hot flashes: She stopped gabapentin  We discussed about Veozah but the cost was $300 a month and she decided not to take it.   Breast Cancer Surveillance: 1. Mammograms at [facility] 06/27/2021: Benign, breast density category B 2. breast exam 11/01/2022: Benign Lipoma is felt below the left breast.   RTC in 1 year for follow-up

## 2022-11-01 NOTE — Progress Notes (Signed)
Patient Care Team: Shirlean Mylar, MD as PCP - General (Family Medicine) Almond Lint, MD as Consulting Physician (General Surgery) Serena Croissant, MD as Consulting Physician (Hematology and Oncology) Dorothy Puffer, MD as Consulting Physician (Radiation Oncology)  DIAGNOSIS:  Encounter Diagnosis  Name Primary?   Ductal carcinoma in situ (DCIS) of left breast Yes    SUMMARY OF ONCOLOGIC HISTORY: Oncology History  Ductal carcinoma in situ (DCIS) of left breast  07/11/2019 Initial Diagnosis   Screening mammogram showed indeterminate left breast calcifications, 2.0cm, 12:30 position. Biopsy showed DCIS, grade 2, ER+ 95%, PR+ 20%.    08/01/2019 Surgery   Left lumpectomy Donell Beers) [phone]): high grade DCIS with necrosis, ER 95%, PR 20%. 2 regional lymph nodes were negative for carcinoma. Clear margins.   09/08/2019 - 10/27/2019 Radiation Therapy   The patient initially received a dose of 50.4 Gy in 28 fractions to the breast using whole-breast tangent fields. This was delivered using a 3-D conformal technique. The patient then received a boost to the seroma. This delivered an additional 10 Gy in 5 fractions using a 3 field photon boost technique. The total dose was 60.4 Gy.   10/27/2019 - 10/2024 Anti-estrogen oral therapy   Tamoxifen   01/31/2020 Cancer Staging   Staging form: Breast, AJCC 8th Edition - Pathologic: Stage 0 (pTis (DCIS), pN0, cM0, ER+, PR+)     CHIEF COMPLIANT: Follow-up on tamoxifen  History of Present Illness   The patient, with a history of breast cancer, has been on tamoxifen for three years. She reports moderate to severe hot flashes, which affect her quality of life. Previously, she was on gabapentin for this symptom, but she is currently not taking any medication for it. She has not tried [employer] , a nonhormonal medication for hot flash treatment. The patient's last breast exam in June was good.        ALLERGIES:  has No Known Allergies.  MEDICATIONS:  Current  Outpatient Medications  Medication Sig Dispense Refill   ascorbic acid (VITAMIN C) 500 MG tablet Take 500 mg by mouth daily.     CALCIUM PO Take 1,200 mg by mouth daily.      FLUoxetine (PROZAC) 40 MG capsule Take 40 mg by mouth daily.     hydrochlorothiazide (HYDRODIURIL) 12.5 MG tablet Take 12.5 mg by mouth daily.     levonorgestrel (MIRENA) 20 MCG/24HR IUD 1 each by Intrauterine route once. Inserted 2017     Multiple Vitamin (MULTIVITAMIN WITH MINERALS) TABS tablet Take 1 tablet by mouth daily.     Semaglutide-Weight Management (WEGOVY) 0.25 MG/0.5ML SOAJ Inject 0.25 mg into the skin once a week. 2 mL 0   Semaglutide-Weight Management (WEGOVY) 0.5 MG/0.5ML SOAJ Inject 0.5 mg into the skin once a week. 2 mL 0   Semaglutide-Weight Management (WEGOVY) 1 MG/0.5ML SOAJ Take 1 mg by mouth once a week. 2 mL 0   Semaglutide-Weight Management (WEGOVY) 1.7 MG/0.75ML SOAJ Inject 1.7 mg into the skin once a week. 3 mL 0   Semaglutide-Weight Management (WEGOVY) 2.4 MG/0.75ML SOAJ Inject 2.4mg  into the skin once a week. 9 mL 1   Semaglutide-Weight Management (WEGOVY) 2.4 MG/0.75ML SOAJ Inject 2.4 mg into the skin once a week. 9 mL 1   tamoxifen (NOLVADEX) 20 MG tablet Take 1 tablet (20 mg total) by mouth daily. 90 tablet 0   No current facility-administered medications for this visit.    PHYSICAL EXAMINATION: ECOG PERFORMANCE STATUS: 1 - Symptomatic but completely ambulatory  Vitals:   11/01/22 1157  BP: 126/70  Pulse: 72  Resp: 18  Temp: (!) 97.5 F (36.4 C)  SpO2: 100%   Filed Weights   11/01/22 1157  Weight: 162 lb (73.5 kg)    Physical Exam breast exam: Benign        (exam performed in the presence of a chaperone)  LABORATORY DATA:  I have reviewed the data as listed    Latest Ref Rng & Units 07/24/2019    1:42 PM 07/16/2019    8:31 AM 12/25/2018    3:07 AM  CMP  Glucose 70 - 99 mg/dL 90  98  960   BUN 6 - 20 mg/dL 19  17  14    Creatinine 0.44 - 1.00 mg/dL 4.54  0.98  1.19    Sodium 135 - 145 mmol/L 137  137  135   Potassium 3.5 - 5.1 mmol/L 4.4  4.6  3.9   Chloride 98 - 111 mmol/L 101  106  102   CO2 22 - 32 mmol/L 27  24  23    Calcium 8.9 - 10.3 mg/dL 9.4  9.4  8.6   Total Protein 6.5 - 8.1 g/dL  7.4  7.1   Total Bilirubin 0.3 - 1.2 mg/dL  0.4  0.6   Alkaline Phos 38 - 126 U/L  70  65   AST 15 - 41 U/L  24  29   ALT 0 - 44 U/L  28  34     Lab Results  Component Value Date   WBC 8.9 07/24/2019   HGB 13.8 07/24/2019   HCT 41.5 07/24/2019   MCV 87.0 07/24/2019   PLT 416 (H) 07/24/2019   NEUTROABS 2.8 07/16/2019    ASSESSMENT & PLAN:  Ductal carcinoma in situ (DCIS) of left breast 07/11/2019:Screening mammogram showed indeterminate left breast calcifications, 2.0cm, 12:30 position. Biopsy showed DCIS with suspicion of microinvasion, grade 2, ER+ 95%, PR+ 20%.    Recommendation:  1. Breast conserving surgery 08/01/19: Left lumpectomy: HG DCIS with necrosis margins neg, 0/2 LN neg ER 95%, PR 20%  2. Followed by adjuvant radiation therapy 09/09/2019-10/24/2019  3. Followed by antiestrogen therapy with tamoxifen 5 years started 10/27/2019    Tamoxifen Toxicities:   hot flashes: She stopped gabapentin  We discussed about Veozah but the cost was $300 a month and she decided not to take it.   Breast Cancer Surveillance: 1. Mammograms at [facility] 06/27/2021: Benign, breast density category B 2. breast exam 11/01/2022: Benign Lipoma is felt below the left breast.   RTC in 1 year for follow-up     No orders of the defined types were placed in this encounter.  The patient has a good understanding of the overall plan. she agrees with it. she will call with any problems that may develop before the next visit here. Total time spent: 30 mins including face to face time and time spent for planning, charting and co-ordination of care   Tamsen Meek, MD 11/01/22

## 2022-11-03 ENCOUNTER — Other Ambulatory Visit (HOSPITAL_COMMUNITY): Payer: Self-pay

## 2022-11-03 ENCOUNTER — Other Ambulatory Visit: Payer: Self-pay

## 2022-11-07 ENCOUNTER — Encounter: Payer: Self-pay | Admitting: Hematology and Oncology

## 2022-12-01 ENCOUNTER — Other Ambulatory Visit (HOSPITAL_COMMUNITY): Payer: Self-pay

## 2022-12-27 ENCOUNTER — Other Ambulatory Visit (HOSPITAL_COMMUNITY): Payer: Self-pay

## 2023-01-22 ENCOUNTER — Other Ambulatory Visit (HOSPITAL_COMMUNITY): Payer: Self-pay

## 2023-02-18 ENCOUNTER — Other Ambulatory Visit (HOSPITAL_COMMUNITY): Payer: Self-pay

## 2023-02-19 ENCOUNTER — Other Ambulatory Visit: Payer: Self-pay

## 2023-02-19 ENCOUNTER — Other Ambulatory Visit (HOSPITAL_COMMUNITY): Payer: Self-pay

## 2023-02-22 ENCOUNTER — Other Ambulatory Visit (HOSPITAL_BASED_OUTPATIENT_CLINIC_OR_DEPARTMENT_OTHER): Payer: Self-pay

## 2023-03-02 ENCOUNTER — Other Ambulatory Visit (HOSPITAL_COMMUNITY): Payer: Self-pay

## 2023-03-05 ENCOUNTER — Other Ambulatory Visit: Payer: Self-pay | Admitting: Hematology and Oncology

## 2023-04-24 DIAGNOSIS — M79644 Pain in right finger(s): Secondary | ICD-10-CM | POA: Diagnosis not present

## 2023-04-24 DIAGNOSIS — Z23 Encounter for immunization: Secondary | ICD-10-CM | POA: Diagnosis not present

## 2023-04-24 DIAGNOSIS — I10 Essential (primary) hypertension: Secondary | ICD-10-CM | POA: Diagnosis not present

## 2023-04-24 DIAGNOSIS — M25511 Pain in right shoulder: Secondary | ICD-10-CM | POA: Diagnosis not present

## 2023-04-24 DIAGNOSIS — E119 Type 2 diabetes mellitus without complications: Secondary | ICD-10-CM | POA: Diagnosis not present

## 2023-07-05 DIAGNOSIS — Z1231 Encounter for screening mammogram for malignant neoplasm of breast: Secondary | ICD-10-CM | POA: Diagnosis not present

## 2023-08-01 DIAGNOSIS — E559 Vitamin D deficiency, unspecified: Secondary | ICD-10-CM | POA: Diagnosis not present

## 2023-08-01 DIAGNOSIS — E119 Type 2 diabetes mellitus without complications: Secondary | ICD-10-CM | POA: Diagnosis not present

## 2023-08-01 DIAGNOSIS — E782 Mixed hyperlipidemia: Secondary | ICD-10-CM | POA: Diagnosis not present

## 2023-08-01 DIAGNOSIS — Z9884 Bariatric surgery status: Secondary | ICD-10-CM | POA: Diagnosis not present

## 2023-08-01 DIAGNOSIS — Z0000 Encounter for general adult medical examination without abnormal findings: Secondary | ICD-10-CM | POA: Diagnosis not present

## 2023-08-01 DIAGNOSIS — Z113 Encounter for screening for infections with a predominantly sexual mode of transmission: Secondary | ICD-10-CM | POA: Diagnosis not present

## 2023-08-01 DIAGNOSIS — I10 Essential (primary) hypertension: Secondary | ICD-10-CM | POA: Diagnosis not present

## 2023-08-08 ENCOUNTER — Other Ambulatory Visit (HOSPITAL_COMMUNITY): Payer: Self-pay | Admitting: Family Medicine

## 2023-08-08 DIAGNOSIS — E782 Mixed hyperlipidemia: Secondary | ICD-10-CM

## 2023-08-21 ENCOUNTER — Other Ambulatory Visit (HOSPITAL_BASED_OUTPATIENT_CLINIC_OR_DEPARTMENT_OTHER): Payer: Self-pay | Admitting: Family Medicine

## 2023-08-21 DIAGNOSIS — E2839 Other primary ovarian failure: Secondary | ICD-10-CM

## 2023-08-24 ENCOUNTER — Ambulatory Visit (HOSPITAL_COMMUNITY)
Admission: RE | Admit: 2023-08-24 | Discharge: 2023-08-24 | Disposition: A | Discharge status: Home / Self Care | Payer: Self-pay | Source: Ambulatory Visit | Attending: Family Medicine | Admitting: Family Medicine

## 2023-08-24 DIAGNOSIS — E782 Mixed hyperlipidemia: Secondary | ICD-10-CM | POA: Insufficient documentation

## 2023-11-01 ENCOUNTER — Inpatient Hospital Stay
Payer: Federal, State, Local not specified - PPO | Attending: Hematology and Oncology | Admitting: Hematology and Oncology

## 2023-11-01 VITALS — BP 137/77 | HR 74 | Temp 97.5°F | Resp 18 | Ht 65.0 in | Wt 163.0 lb

## 2023-11-01 DIAGNOSIS — Z79810 Long term (current) use of selective estrogen receptor modulators (SERMs): Secondary | ICD-10-CM | POA: Diagnosis not present

## 2023-11-01 DIAGNOSIS — R232 Flushing: Secondary | ICD-10-CM | POA: Diagnosis not present

## 2023-11-01 DIAGNOSIS — Z923 Personal history of irradiation: Secondary | ICD-10-CM | POA: Diagnosis not present

## 2023-11-01 DIAGNOSIS — D0512 Intraductal carcinoma in situ of left breast: Secondary | ICD-10-CM | POA: Diagnosis not present

## 2023-11-01 DIAGNOSIS — R634 Abnormal weight loss: Secondary | ICD-10-CM | POA: Diagnosis not present

## 2023-11-01 DIAGNOSIS — Z170 Estrogen receptor positive status [ER+]: Secondary | ICD-10-CM | POA: Diagnosis not present

## 2023-11-01 DIAGNOSIS — Z1721 Progesterone receptor positive status: Secondary | ICD-10-CM | POA: Insufficient documentation

## 2023-11-01 NOTE — Assessment & Plan Note (Signed)
 07/11/2019:Screening mammogram showed indeterminate left breast calcifications, 2.0cm, 12:30 position. Biopsy showed DCIS with suspicion of microinvasion, grade 2, ER+ 95%, PR+ 20%.    Treatment summary:  1. Breast conserving surgery 08/01/19: Left lumpectomy: HG DCIS with necrosis margins neg, 0/2 LN neg ER 95%, PR 20%  2. Followed by adjuvant radiation therapy 09/09/2019-10/24/2019  3. Followed by antiestrogen therapy with tamoxifen  5 years started 10/27/2019    Tamoxifen  Toxicities:   hot flashes: She stopped gabapentin   We discussed about Veozah but the cost was $300 a month and she decided not to take it.   Breast Cancer Surveillance: 1. Mammograms at [facility] 06/27/2021: Benign, breast density category B 2. 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   RTC in 1 year for follow-up

## 2023-11-01 NOTE — Progress Notes (Signed)
 Patient Care Team: Dyane Anthony RAMAN, FNP as PCP - General (Family Medicine) Aron Shoulders, MD as Consulting Physician (General Surgery) Odean Potts, MD as Consulting Physician (Hematology and Oncology) Dewey Rush, MD as Consulting Physician (Radiation Oncology)  DIAGNOSIS:  Encounter Diagnosis  Name Primary?   Ductal carcinoma in situ (DCIS) of left breast Yes    SUMMARY OF ONCOLOGIC HISTORY: Oncology History  Ductal carcinoma in situ (DCIS) of left breast  07/11/2019 Initial Diagnosis   Screening mammogram showed indeterminate left breast calcifications, 2.0cm, 12:30 position. Biopsy showed DCIS, grade 2, ER+ 95%, PR+ 20%.    08/01/2019 Surgery   Left lumpectomy Azucena) [phone]): high grade DCIS with necrosis, ER 95%, PR 20%. 2 regional lymph nodes were negative for carcinoma. Clear margins.   09/08/2019 - 10/27/2019 Radiation Therapy   The patient initially received a dose of 50.4 Gy in 28 fractions to the breast using whole-breast tangent fields. This was delivered using a 3-D conformal technique. The patient then received a boost to the seroma. This delivered an additional 10 Gy in 5 fractions using a 3 field photon boost technique. The total dose was 60.4 Gy.   10/27/2019 - 10/2024 Anti-estrogen oral therapy   Tamoxifen    01/31/2020 Cancer Staging   Staging form: Breast, AJCC 8th Edition - Pathologic: Stage 0 (pTis (DCIS), pN0, cM0, ER+, PR+)     CHIEF COMPLIANT:   HISTORY OF PRESENT ILLNESS: Discussed the use of AI scribe software for clinical note transcription with the patient, who gave verbal consent to proceed.  History of Present Illness Leah ALTIDOR is a 58 year old female with DCIS who presents for follow-up regarding her treatment and symptoms.  She experiences ongoing hot flashes associated with her tamoxifen  treatment, which she manages reasonably well. She is currently taking 10 mg of tamoxifen  daily.  She notes significant weight loss but does  not track her weight closely. She maintains an active lifestyle, exercising for at least 30 minutes five days a week.  There is no new or different pain or discomfort in her breasts. Her last mammogram in June showed benign results with a B density.  She experiences some discomfort in her left femur, described as a mild ache. 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 She is unsure how the rib fracture occurred.     ALLERGIES:  has no known allergies.  MEDICATIONS:  Current Outpatient Medications  Medication Sig Dispense Refill   ascorbic acid (VITAMIN C) 500 MG tablet Take 500 mg by mouth daily.     CALCIUM PO Take 1,200 mg by mouth daily.      FLUoxetine  (PROZAC ) 40 MG capsule Take 40 mg by mouth daily.     hydrochlorothiazide  (HYDRODIURIL ) 12.5 MG tablet Take 12.5 mg by mouth daily.     levonorgestrel (MIRENA) 20 MCG/24HR IUD 1 each by Intrauterine route once. Inserted 2017     Multiple Vitamin (MULTIVITAMIN WITH MINERALS) TABS tablet Take 1 tablet by mouth daily.     Semaglutide -Weight Management (WEGOVY ) 0.25 MG/0.5ML SOAJ Inject 0.25 mg into the skin once a week. 2 mL 0   Semaglutide -Weight Management (WEGOVY ) 0.5 MG/0.5ML SOAJ Inject 0.5 mg into the skin once a week. 2 mL 0   Semaglutide -Weight Management (WEGOVY ) 1 MG/0.5ML SOAJ Take 1 mg by mouth once a week. 2 mL 0   Semaglutide -Weight Management (WEGOVY ) 1.7 MG/0.75ML SOAJ Inject 1.7 mg into the skin once a week. 3  mL 0   Semaglutide -Weight Management (WEGOVY ) 2.4 MG/0.75ML SOAJ Inject 2.4 mg into the skin once a week. 9 mL 1   Semaglutide -Weight Management (WEGOVY ) 2.4 MG/0.75ML SOAJ Inject 2.4 mg into the skin once a week. 9 mL 1   tamoxifen  (NOLVADEX ) 20 MG tablet TAKE 1 TABLET BY MOUTH EVERY DAY 90 tablet 0   No current facility-administered medications for this visit.    PHYSICAL EXAMINATION: ECOG PERFORMANCE STATUS: 1 - Symptomatic but completely  ambulatory  There were no vitals filed for this visit. There were no vitals filed for this visit.  Physical Exam   (exam performed in the presence of a chaperone)  LABORATORY DATA:  I have reviewed the data as listed    Latest Ref Rng & Units 07/24/2019    1:42 PM 07/16/2019    8:31 AM 12/25/2018    3:07 AM  CMP  Glucose 70 - 99 mg/dL 90  98  870   BUN 6 - 20 mg/dL 19  17  14    Creatinine 0.44 - 1.00 mg/dL 9.18  9.13  9.34   Sodium 135 - 145 mmol/L 137  137  135   Potassium 3.5 - 5.1 mmol/L 4.4  4.6  3.9   Chloride 98 - 111 mmol/L 101  106  102   CO2 22 - 32 mmol/L 27  24  23    Calcium 8.9 - 10.3 mg/dL 9.4  9.4  8.6   Total Protein 6.5 - 8.1 g/dL  7.4  7.1   Total Bilirubin 0.3 - 1.2 mg/dL  0.4  0.6   Alkaline Phos 38 - 126 U/L  70  65   AST 15 - 41 U/L  24  29   ALT 0 - 44 U/L  28  34     Lab Results  Component Value Date   WBC 8.9 07/24/2019   HGB 13.8 07/24/2019   HCT 41.5 07/24/2019   MCV 87.0 07/24/2019   PLT 416 (H) 07/24/2019   NEUTROABS 2.8 07/16/2019    ASSESSMENT & PLAN:  Ductal carcinoma in situ (DCIS) of left breast 07/11/2019:Screening mammogram showed indeterminate left breast calcifications, 2.0cm, 12:30 position. Biopsy showed DCIS with suspicion of microinvasion, grade 2, ER+ 95%, PR+ 20%.    Treatment summary:  1. Breast conserving surgery 08/01/19: Left lumpectomy: HG DCIS with necrosis margins neg, 0/2 LN neg ER 95%, PR 20%  2. Followed by adjuvant radiation therapy 09/09/2019-10/24/2019  3. Followed by antiestrogen therapy with tamoxifen  5 years started 10/27/2019    Tamoxifen  Toxicities:   hot flashes: They are not any better but she is handling the hot flashes reasonably well.   Breast Cancer Surveillance: 1. Mammograms at [facility] 06/27/2021: Benign, breast density category B 2. 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 I reviewed the CT chest (cardiac CT) 08/28/2023: Left sixth rib lesion (solitary in nature) felt to be  related to a prior fracture.  Since there are no other bone lesions anywhere, and her diagnosis originally was DCIS, I am not concerned about metastatic disease.   RTC in 1 year for follow-up   No orders of the defined types were placed in this encounter.  The patient has a good understanding of the overall plan. she agrees with it. she will call with any problems that may develop before the next visit here.  I personally spent a total of 30 minutes in the care of the patient today including preparing to see the patient, getting/reviewing separately  obtained history, performing a medically appropriate exam/evaluation, counseling and educating, placing orders, referring and communicating with other health care professionals, documenting clinical information in the EHR, independently interpreting results, communicating results, and coordinating care.   Viinay K Talayla Doyel, MD 11/01/23

## 2024-04-29 ENCOUNTER — Other Ambulatory Visit (HOSPITAL_BASED_OUTPATIENT_CLINIC_OR_DEPARTMENT_OTHER)

## 2024-11-03 ENCOUNTER — Ambulatory Visit: Admitting: Hematology and Oncology
